# Patient Record
Sex: Male | Born: 1965 | Race: White | Hispanic: No | Marital: Married | Smoking: Current every day smoker
Health system: Southern US, Community
[De-identification: ages and names within clinical notes are randomized; demographics above are authoritative.]

## PROBLEM LIST (undated history)

## (undated) DIAGNOSIS — F419 Anxiety disorder, unspecified: Secondary | ICD-10-CM

## (undated) DIAGNOSIS — M549 Dorsalgia, unspecified: Secondary | ICD-10-CM

## (undated) DIAGNOSIS — E785 Hyperlipidemia, unspecified: Secondary | ICD-10-CM

## (undated) DIAGNOSIS — L0292 Furuncle, unspecified: Secondary | ICD-10-CM

## (undated) DIAGNOSIS — R12 Heartburn: Secondary | ICD-10-CM

## (undated) DIAGNOSIS — T7840XA Allergy, unspecified, initial encounter: Secondary | ICD-10-CM

## (undated) DIAGNOSIS — G8929 Other chronic pain: Secondary | ICD-10-CM

## (undated) DIAGNOSIS — M1990 Unspecified osteoarthritis, unspecified site: Secondary | ICD-10-CM

## (undated) DIAGNOSIS — K219 Gastro-esophageal reflux disease without esophagitis: Secondary | ICD-10-CM

## (undated) DIAGNOSIS — IMO0001 Reserved for inherently not codable concepts without codable children: Secondary | ICD-10-CM

## (undated) HISTORY — DX: Other chronic pain: G89.29

## (undated) HISTORY — PX: COLONOSCOPY: SHX174

## (undated) HISTORY — DX: Anxiety disorder, unspecified: F41.9

## (undated) HISTORY — DX: Hyperlipidemia, unspecified: E78.5

## (undated) HISTORY — DX: Dorsalgia, unspecified: M54.9

## (undated) HISTORY — PX: INGUINAL HERNIA REPAIR: SUR1180

## (undated) HISTORY — DX: Heartburn: R12

## (undated) HISTORY — DX: Unspecified osteoarthritis, unspecified site: M19.90

## (undated) HISTORY — PX: WISDOM TOOTH EXTRACTION: SHX21

## (undated) HISTORY — PX: KNEE SURGERY: SHX244

## (undated) HISTORY — DX: Gastro-esophageal reflux disease without esophagitis: K21.9

## (undated) HISTORY — PX: HAND SURGERY: SHX662

## (undated) HISTORY — PX: POLYPECTOMY: SHX149

## (undated) HISTORY — DX: Allergy, unspecified, initial encounter: T78.40XA

## (undated) HISTORY — PX: BACK SURGERY: SHX140

---

## 2004-07-01 ENCOUNTER — Ambulatory Visit: Payer: Self-pay | Admitting: Internal Medicine

## 2007-11-24 ENCOUNTER — Ambulatory Visit: Payer: Self-pay

## 2007-12-25 ENCOUNTER — Ambulatory Visit: Payer: Self-pay

## 2008-08-26 ENCOUNTER — Ambulatory Visit: Payer: Self-pay | Admitting: Internal Medicine

## 2010-11-10 ENCOUNTER — Ambulatory Visit: Payer: Self-pay | Admitting: Medical

## 2011-01-21 ENCOUNTER — Other Ambulatory Visit: Payer: Self-pay | Admitting: Internal Medicine

## 2011-01-21 DIAGNOSIS — M545 Low back pain, unspecified: Secondary | ICD-10-CM

## 2011-01-22 ENCOUNTER — Ambulatory Visit: Payer: Self-pay | Admitting: Urology

## 2011-01-24 ENCOUNTER — Ambulatory Visit
Admission: RE | Admit: 2011-01-24 | Discharge: 2011-01-24 | Disposition: A | Discharge status: Home / Self Care | Payer: BC Managed Care – PPO | Source: Ambulatory Visit | Attending: Internal Medicine | Admitting: Internal Medicine

## 2011-01-24 DIAGNOSIS — M545 Low back pain, unspecified: Secondary | ICD-10-CM

## 2011-11-24 ENCOUNTER — Encounter: Payer: Self-pay | Admitting: Family Medicine

## 2011-11-24 ENCOUNTER — Ambulatory Visit (INDEPENDENT_AMBULATORY_CARE_PROVIDER_SITE_OTHER): Payer: BC Managed Care – PPO | Admitting: Family Medicine

## 2011-11-24 VITALS — BP 120/92 | HR 76 | Temp 98.7°F | Ht 70.0 in | Wt 176.0 lb

## 2011-11-24 DIAGNOSIS — E785 Hyperlipidemia, unspecified: Secondary | ICD-10-CM

## 2011-11-24 DIAGNOSIS — G8929 Other chronic pain: Secondary | ICD-10-CM

## 2011-11-24 DIAGNOSIS — M549 Dorsalgia, unspecified: Secondary | ICD-10-CM | POA: Insufficient documentation

## 2011-11-24 NOTE — Progress Notes (Signed)
Subjective:    Patient ID: Benjamin Ball, male    DOB: 1965/04/11, 46 y.o.   MRN: [id number]  HPI  Very pleasant 46 yo male with h/o tobacco abuse, HLD, chronic back pain here to establish care.  Brings in labs from his wellness exam at work.  HLD- TG 101, HDL 50, LDL 155. He refuses statin therapy. Family h/o CAD- dad died of MI at 59, not sure how old he was when he had his first MI.  Tobacco abuse- 1 - 2 ppd, states he has tried "everything," including Chantix. Not ready to quit.  Chronic back pain- followed by pain management.  H/o lumbar bulging discs.  On Vicodin, gabapentin.   Patient Active Problem List  Diagnosis  . Hyperlipidemia  . Chronic back pain   Past Medical History  Diagnosis Date  . Hyperlipidemia   . Chronic back pain    Past Surgical History  Procedure Date  . Knee surgery    History  Substance Use Topics  . Smoking status: Current Every Day Smoker  . Smokeless tobacco: Not on file  . Alcohol Use: Not on file   Family History  Problem Relation Age of Onset  . Heart disease Father    No Known Allergies Current Outpatient Prescriptions on File Prior to Visit  Medication Sig Dispense Refill  . gabapentin (NEURONTIN) 300 MG capsule Take 300 mg by mouth 4 (four) times daily.       The PMH, PSH, Social History, Family History, Medications, and allergies have been reviewed in [facility], and have been updated if relevant.   Review of Systems See HPI Patient reports no  vision/ hearing changes,anorexia, weight change, fever ,adenopathy, persistant / recurrent hoarseness, swallowing issues, chest pain, edema,persistant / recurrent cough, hemoptysis, dyspnea(rest, exertional, paroxysmal nocturnal), gastrointestinal  bleeding (melena, rectal bleeding), abdominal pain, excessive heart burn, GU symptoms(dysuria, hematuria, pyuria, voiding/incontinence  Issues) syncope, focal weakness, severe memory loss, concerning skin lesions, depression, anxiety, abnormal  bruising/bleeding, major joint swelling.       Objective:   Physical Exam BP 120/92  Pulse 76  Temp 98.7 F (37.1 C)  Ht 5\' 10"  (1.778 m)  Wt 176 lb (79.833 kg)  BMI 25.25 kg/m2 General:  overweght male in NAD Eyes:  PERRL Ears:  External ear exam shows no significant lesions or deformities.  Otoscopic examination reveals clear canals, tympanic membranes are intact bilaterally without bulging, retraction, inflammation or discharge. Hearing is grossly normal bilaterally. Nose:  External nasal examination shows no deformity or inflammation. Nasal mucosa are pink and moist without lesions or exudates. Mouth:  Oral mucosa and oropharynx without lesions or exudates.  Teeth in good repair. Neck:  no carotid bruit or thyromegaly no cervical or supraclavicular lymphadenopathy  Lungs:  Normal respiratory effort, chest expands symmetrically. Lungs are clear to auscultation, no crackles or wheezes. Heart:  Normal rate and regular rhythm. S1 and S2 normal without gallop, murmur, click, rub or other extra sounds. Abdomen:  Bowel sounds positive,abdomen soft and non-tender without masses, organomegaly or hernias noted. Pulses:  R and L posterior tibial pulses are full and equal bilaterally  Extremities:  no edema         Assessment & Plan:   1. Hyperlipidemia  Declined starting statin despite discussing the importance of lowering his risks for CAD given family history and tobacco use.   He wants to work on diet and lifestyle modification and will follow up in 3-6 months.  2. Chronic back pain  Managed by pain  management.  Stable.

## 2011-11-24 NOTE — Patient Instructions (Addendum)
Nice to meet you. Please come back to see me in 3-6 months to follow up your cholesterol.

## 2012-02-24 ENCOUNTER — Ambulatory Visit: Payer: BC Managed Care – PPO | Admitting: Family Medicine

## 2012-05-29 ENCOUNTER — Telehealth: Payer: Self-pay | Admitting: Family Medicine

## 2012-05-29 NOTE — Telephone Encounter (Signed)
Rx written and in my box. 

## 2012-05-29 NOTE — Telephone Encounter (Signed)
Order mailed to patient's home address.

## 2012-05-29 NOTE — Telephone Encounter (Signed)
Patient has an appointment for a cpx on 07/20/12.  Patient's wife works for [employer] and she asked for an order for lab work be mailed to patient.

## 2012-07-20 ENCOUNTER — Encounter: Payer: Self-pay | Admitting: Family Medicine

## 2012-07-20 ENCOUNTER — Ambulatory Visit (INDEPENDENT_AMBULATORY_CARE_PROVIDER_SITE_OTHER): Payer: BC Managed Care – PPO | Admitting: Family Medicine

## 2012-07-20 VITALS — BP 118/74 | HR 60 | Temp 98.1°F | Ht 70.0 in | Wt 165.0 lb

## 2012-07-20 DIAGNOSIS — M549 Dorsalgia, unspecified: Secondary | ICD-10-CM

## 2012-07-20 DIAGNOSIS — E559 Vitamin D deficiency, unspecified: Secondary | ICD-10-CM | POA: Insufficient documentation

## 2012-07-20 DIAGNOSIS — L301 Dyshidrosis [pompholyx]: Secondary | ICD-10-CM | POA: Insufficient documentation

## 2012-07-20 DIAGNOSIS — Z23 Encounter for immunization: Secondary | ICD-10-CM

## 2012-07-20 DIAGNOSIS — Z0000 Encounter for general adult medical examination without abnormal findings: Secondary | ICD-10-CM | POA: Insufficient documentation

## 2012-07-20 DIAGNOSIS — G8929 Other chronic pain: Secondary | ICD-10-CM

## 2012-07-20 DIAGNOSIS — E785 Hyperlipidemia, unspecified: Secondary | ICD-10-CM

## 2012-07-20 MED ORDER — VITAMIN D (ERGOCALCIFEROL) 1.25 MG (50000 UNIT) PO CAPS: 50000.0000 [IU] | ORAL_CAPSULE | ORAL | Status: DC

## 2012-07-20 NOTE — Patient Instructions (Addendum)
Let's start taking Vit D 50,000 IU weekly for 6 weeks, then restart your regular dose of 2000 IU daily.  Hand Dermatitis Hand dermatitis (dyshidrotic eczema) is a skin condition in which small, itchy, raised dots or fluid-filled blisters form over the palms of the hands. Outbreaks of hand dermatitis can last 3 to 4 weeks. CAUSES  The cause of hand dermatitis is unknown. However, it occurs most often in patients with a history of allergies such as:  Hay fever.  Allergic asthma.  Allergies to latex. Chemical exposure, injuries, and environmental irritants can make hand dermatitis worse. Washing your hands too frequently can remove natural oils, which can dry out the skin and contribute to outbreaks of hand dermatitis. SYMPTOMS  The most common symptom of hand dermatitis is intense itching. Cracks or grooves (fissures) on the fingers can also develop. Affected areas can be painful, especially areas where large blisters have formed. DIAGNOSIS Your caregiver can usually tell what the problem is by doing a physical exam. PREVENTION  Avoid excessive hand washing.  Avoid the use of harsh chemicals.  Wear protective gloves when handling products that can irritate your skin. TREATMENT  Steroid creams and ointments, such as over-the-counter 1% hydrocortisone cream, can reduce inflammation and improve moisture retention. These should be applied at least 2 to 4 times per day. Your caregiver may ask you to use a stronger prescription steroid cream to help speed the healing of blistered and cracked skin. In severe cases, oral steroid medicine may be needed. If you have an infection, antibiotics may be needed. Your caregiver may also prescribe antihistamines. These medicines help reduce itching. HOME CARE INSTRUCTIONS  Only take over-the-counter or prescription medicines as directed by your caregiver.  You may use wet or cold compresses. This can help:  Alleviate itching.  Increase the  effectiveness of topical creams.  Minimize blisters. SEEK MEDICAL CARE IF:  The rash is not better after 1 week of treatment.  Signs of infection develop, such as redness, tenderness, or yellowish-white fluid (pus).  The rash is spreading. Document Released: 12/28/2004 Document Revised: 03/22/2011 Document Reviewed: 05/27/2010 [facility] Patient Information 2014 [place], [place].

## 2012-07-20 NOTE — Progress Notes (Signed)
Subjective:    Patient ID: Benjamin Ball, male    DOB: 12-31-1965, 47 y.o.   MRN: [id number]  HPI  Very pleasant 47 yo male with h/o tobacco abuse, HLD, chronic back pain here for CPX.  Brings in labs from his wellness exam at work.  HLD- TG 78, HDL 50, LDL 104 (much improved).  Family h/o CAD- dad died of MI at 10, not sure how old he was when he had his first MI.  Tobacco abuse- 1 - 2 ppd, states he has tried "everything," including Chantix. Not ready to quit.  Chronic back pain- followed by pain management.  H/o lumbar bulging discs.  On Vicodin, gabapentin.  He feels this is not helping like it should but is refusing injections and surgery.  Vit D deficiency- Vit D 20.  He is supposed to be taking 2000 IU of vit D daily but often forgets.  Has been more tired lately.  PSA, testosterone, Thyroid function all within normal limits.   Patient Active Problem List   Diagnosis Date Noted  . Routine general medical examination at a health care facility 07/20/2012  . Hyperlipidemia   . Chronic back pain    Past Medical History  Diagnosis Date  . Hyperlipidemia   . Chronic back pain    Past Surgical History  Procedure Laterality Date  . Knee surgery     History  Substance Use Topics  . Smoking status: Current Every Day Smoker  . Smokeless tobacco: Not on file  . Alcohol Use: Not on file   Family History  Problem Relation Age of Onset  . Heart disease Father    No Known Allergies Current Outpatient Prescriptions on File Prior to Visit  Medication Sig Dispense Refill  . Cholecalciferol (VITAMIN D) 2000 UNITS CAPS Take by mouth.      . gabapentin (NEURONTIN) 300 MG capsule Take 300 mg by mouth 4 (four) times daily.      Marland Kitchen HYDROcodone-acetaminophen (NORCO/VICODIN) 5-325 MG per tablet Take 1 tablet by mouth every 8 (eight) hours as needed.       No current facility-administered medications on file prior to visit.   The PMH, PSH, Social History, Family History,  Medications, and allergies have been reviewed in [facility], and have been updated if relevant.   Review of Systems See HPI Patient reports no  vision/ hearing changes,anorexia, weight change, fever ,adenopathy, persistant / recurrent hoarseness, swallowing issues, chest pain, edema,persistant / recurrent cough, hemoptysis, dyspnea(rest, exertional, paroxysmal nocturnal), gastrointestinal  bleeding (melena, rectal bleeding), abdominal pain, excessive heart burn, GU symptoms(dysuria, hematuria, pyuria, voiding/incontinence  Issues) syncope, focal weakness, severe memory loss, concerning skin lesions, depression, anxiety, abnormal bruising/bleeding, major joint swelling.       Objective:   Physical Exam BP 118/74  Pulse 60  Temp(Src) 98.1 F (36.7 C)  Ht 5\' 10"  (1.778 m)  Wt 165 lb (74.844 kg)  BMI 23.68 kg/m2 General:  overweght male in NAD Eyes:  PERRL Ears:  External ear exam shows no significant lesions or deformities.  Otoscopic examination reveals clear canals, tympanic membranes are intact bilaterally without bulging, retraction, inflammation or discharge. Hearing is grossly normal bilaterally. Nose:  External nasal examination shows no deformity or inflammation. Nasal mucosa are pink and moist without lesions or exudates. Mouth:  Oral mucosa and oropharynx without lesions or exudates.  Teeth in good repair. Neck:  no carotid bruit or thyromegaly no cervical or supraclavicular lymphadenopathy  Lungs:  Normal respiratory effort, chest expands symmetrically. Lungs are clear  to auscultation, no crackles or wheezes. Heart:  Normal rate and regular rhythm. S1 and S2 normal without gallop, murmur, click, rub or other extra sounds. Abdomen:  Bowel sounds positive,abdomen soft and non-tender without masses, organomegaly or hernias noted. Pulses:  R and L posterior tibial pulses are full and equal bilaterally  Extremities:  no edema  Dry, peeling skin on palms of hands bilaterally    Assessment &  Plan:  1. Routine general medical examination at a health care facility Reviewed preventive care protocols, scheduled due services, and updated immunizations Discussed nutrition, exercise, diet, and healthy lifestyle. Tdap  2. Chronic back pain Followed by pain management.   3. Hyperlipidemia Improved!  4. Eczema, dyshidrotic Discussed course and tx.  See AVS for support care.  5. Unspecified vitamin D deficiency Vit D 50,000 IU weekly x 6 weeks, then resume 2000 IU daily. The patient indicates understanding of these issues and agrees with the plan.

## 2012-07-25 ENCOUNTER — Encounter: Payer: Self-pay | Admitting: Family Medicine

## 2012-09-29 ENCOUNTER — Other Ambulatory Visit: Payer: Self-pay | Admitting: Orthopaedic Surgery

## 2012-09-29 DIAGNOSIS — M545 Low back pain, unspecified: Secondary | ICD-10-CM

## 2012-10-04 ENCOUNTER — Ambulatory Visit
Admission: RE | Admit: 2012-10-04 | Discharge: 2012-10-04 | Disposition: A | Discharge status: Home / Self Care | Payer: BC Managed Care – PPO | Source: Ambulatory Visit | Attending: Orthopaedic Surgery | Admitting: Orthopaedic Surgery

## 2012-10-04 DIAGNOSIS — M545 Low back pain, unspecified: Secondary | ICD-10-CM

## 2013-03-19 ENCOUNTER — Telehealth: Payer: Self-pay

## 2013-03-19 NOTE — Telephone Encounter (Signed)
Spoke to [place] and informed her Rx is at the front desk and available for pickup.

## 2013-03-19 NOTE — Telephone Encounter (Signed)
Mrs Kohen left v/m that pt stopped smoking 11/2012; For pt  to get less expensive rate on insurance pt needs written order to take to [facility] for a nicotine test. Mrs Lanz request cb when ready for pickup.

## 2013-03-19 NOTE — Telephone Encounter (Signed)
rx written and on my desk.

## 2013-04-12 ENCOUNTER — Encounter: Payer: Self-pay | Admitting: Family Medicine

## 2013-04-12 ENCOUNTER — Ambulatory Visit (INDEPENDENT_AMBULATORY_CARE_PROVIDER_SITE_OTHER): Payer: BC Managed Care – PPO | Admitting: Family Medicine

## 2013-04-12 VITALS — BP 102/72 | HR 60 | Temp 98.2°F

## 2013-04-12 DIAGNOSIS — R55 Syncope and collapse: Secondary | ICD-10-CM

## 2013-04-12 LAB — POCT CBG (FASTING - GLUCOSE)-MANUAL ENTRY: Glucose Fasting, POC: 110 mg/dL — AB (ref 70–99)

## 2013-04-12 NOTE — Progress Notes (Signed)
Pre visit review using our clinic review tool, if applicable. No additional management support is needed unless otherwise documented below in the visit note.  No meds, no allergies.  Skipped breakfast and lunch.  Here with wife for her appointment.   Was standing up, looked at me and asked to go to the BR.  Then eyes rolled back in his head, skin became gray and sweaty.  Stopped talking, no apnea.  I hugged him to keep him from falling and told wife to get staff to room.  Staff arrived.  I put patient in chair, he didn't fall.  Shortly regained speech, orientation.  No apnea.  H/o similar prev with prolonged fasting.   Meds, vitals, and allergies reviewed.   ROS: See HPI.  Otherwise, noncontributory.  Initially unresponsive but breathing, grey skin color, sweaty.  Initial BP low as above.  Remained sitting in chair.   ncat  Mmm rrr ctab abd soft CN 2-12 wnl B, S/S/DTR wnl x4 Speech wnl, orientation wnl.  Skin color returned to normal.   Felt better after a snack.  Glucose checked after he was eating snack.  Repeat BP on standing acceptable.  Nor orthostatic on standing.

## 2013-04-12 NOTE — Assessment & Plan Note (Signed)
Likely from hypoglycemia.   Advised to go home and eat, but not to drive today.   All sx resolved by time he left office.  H/o similar prev.   D/w pt.  He agrees.

## 2013-04-16 ENCOUNTER — Telehealth: Payer: Self-pay | Admitting: Family Medicine

## 2013-04-16 NOTE — Telephone Encounter (Signed)
Relevant patient education mailed to patient.  

## 2013-04-19 ENCOUNTER — Encounter: Payer: Self-pay | Admitting: Family Medicine

## 2013-12-18 ENCOUNTER — Encounter: Payer: Self-pay | Admitting: Family Medicine

## 2013-12-18 ENCOUNTER — Ambulatory Visit (INDEPENDENT_AMBULATORY_CARE_PROVIDER_SITE_OTHER): Payer: BC Managed Care – PPO | Admitting: Family Medicine

## 2013-12-18 ENCOUNTER — Ambulatory Visit: Payer: Self-pay | Admitting: Family Medicine

## 2013-12-18 ENCOUNTER — Encounter: Payer: Self-pay | Admitting: Gastroenterology

## 2013-12-18 VITALS — BP 102/70 | HR 75 | Temp 98.5°F | Ht 70.0 in | Wt 174.5 lb

## 2013-12-18 DIAGNOSIS — M549 Dorsalgia, unspecified: Secondary | ICD-10-CM

## 2013-12-18 DIAGNOSIS — N5089 Other specified disorders of the male genital organs: Secondary | ICD-10-CM

## 2013-12-18 DIAGNOSIS — Z720 Tobacco use: Secondary | ICD-10-CM

## 2013-12-18 DIAGNOSIS — G8929 Other chronic pain: Secondary | ICD-10-CM

## 2013-12-18 DIAGNOSIS — N508 Other specified disorders of male genital organs: Secondary | ICD-10-CM

## 2013-12-18 DIAGNOSIS — Z1211 Encounter for screening for malignant neoplasm of colon: Secondary | ICD-10-CM

## 2013-12-18 DIAGNOSIS — E559 Vitamin D deficiency, unspecified: Secondary | ICD-10-CM

## 2013-12-18 DIAGNOSIS — E785 Hyperlipidemia, unspecified: Secondary | ICD-10-CM

## 2013-12-18 DIAGNOSIS — Z0000 Encounter for general adult medical examination without abnormal findings: Secondary | ICD-10-CM

## 2013-12-18 DIAGNOSIS — Z981 Arthrodesis status: Secondary | ICD-10-CM | POA: Insufficient documentation

## 2013-12-18 LAB — CBC WITH DIFFERENTIAL/PLATELET
BASOS PCT: 0.5 % (ref 0.0–3.0)
Basophils Absolute: 0 10*3/uL (ref 0.0–0.1)
Eosinophils Absolute: 0.3 10*3/uL (ref 0.0–0.7)
Eosinophils Relative: 3.9 % (ref 0.0–5.0)
HCT: 44.3 % (ref 39.0–52.0)
Hemoglobin: 14.6 g/dL (ref 13.0–17.0)
Lymphocytes Relative: 23.9 % (ref 12.0–46.0)
Lymphs Abs: 1.8 10*3/uL (ref 0.7–4.0)
MCHC: 33 g/dL (ref 30.0–36.0)
MCV: 95.9 fl (ref 78.0–100.0)
MONO ABS: 0.6 10*3/uL (ref 0.1–1.0)
Monocytes Relative: 8.6 % (ref 3.0–12.0)
NEUTROS PCT: 63.1 % (ref 43.0–77.0)
Neutro Abs: 4.6 10*3/uL (ref 1.4–7.7)
PLATELETS: 215 10*3/uL (ref 150.0–400.0)
RBC: 4.62 Mil/uL (ref 4.22–5.81)
RDW: 13.8 % (ref 11.5–15.5)
WBC: 7.4 10*3/uL (ref 4.0–10.5)

## 2013-12-18 LAB — COMPREHENSIVE METABOLIC PANEL
ALT: 14 U/L (ref 0–53)
AST: 18 U/L (ref 0–37)
Albumin: 4 g/dL (ref 3.5–5.2)
Alkaline Phosphatase: 76 U/L (ref 39–117)
BUN: 9 mg/dL (ref 6–23)
CO2: 29 mEq/L (ref 19–32)
Calcium: 9.2 mg/dL (ref 8.4–10.5)
Chloride: 104 mEq/L (ref 96–112)
Creatinine, Ser: 1 mg/dL (ref 0.4–1.5)
GFR: 87.91 mL/min (ref >60.00)
Glucose, Bld: 89 mg/dL (ref 70–99)
Potassium: 5.2 mEq/L — ABNORMAL HIGH (ref 3.5–5.1)
SODIUM: 139 meq/L (ref 135–145)
Total Bilirubin: 0.3 mg/dL (ref 0.2–1.2)
Total Protein: 6.8 g/dL (ref 6.0–8.3)

## 2013-12-18 LAB — LIPID PANEL
Cholesterol: 207 mg/dL — ABNORMAL HIGH (ref 0–200)
HDL: 46.1 mg/dL (ref >39.00)
LDL CALC: 142 mg/dL — AB (ref 0–99)
NonHDL: 160.9
TRIGLYCERIDES: 97 mg/dL (ref 0.0–149.0)
Total CHOL/HDL Ratio: 4
VLDL: 19.4 mg/dL (ref 0.0–40.0)

## 2013-12-18 LAB — PSA: PSA: 1.24 ng/mL (ref 0.10–4.00)

## 2013-12-18 MED ORDER — GABAPENTIN 300 MG PO CAPS: 300.0000 mg | ORAL_CAPSULE | Freq: Every day | ORAL | Status: DC

## 2013-12-18 MED ORDER — BUPROPION HCL ER (SMOKING DET) 150 MG PO TB12: 150.0000 mg | ORAL_TABLET | Freq: Two times a day (BID) | ORAL | Status: DC

## 2013-12-18 NOTE — Assessment & Plan Note (Signed)
Unfortunately persistent s/p lumbar fusion. He is asking whether he needs to restart narcotics. I suggested PT- order handed to pt and restarting gabapentin since pain is associated with radiculopathy. The patient indicates understanding of these issues and agrees with the plan.

## 2013-12-18 NOTE — Progress Notes (Signed)
Subjective:    Patient ID: Benjamin Ball, male    DOB: 1965/12/08, 48 y.o.   MRN: [id number]  HPI  Very pleasant 48 yo male with h/o tobacco abuse, HLD, chronic back pain here for CPX.  Influenza vaccine 10/25/13 Tdap 07/20/12 Has never had a colonoscopy.  Due for labs. Had back surgery (360 lumbar fusion) in 11/2012- was doing well then went back to work and having the same pain with radiculopathy.  Saw his neurosurgeon last month and was told "xrays look good" and he is just in the 10% that do not respond to the surgery.  He does have a very physical job. Has tried NSAIDs and Tylenol with minimal response.   Considering water aerobics.  Has never had PT.  Cannabis does help to take the edge off. Was previously followed by pain management, no longer taking vicodin, gabapentin or receiving spinal injections.    Family h/o CAD- dad died of MI at 64, not sure how old he was when he had his first MI.  Tobacco abuse- 1 - 2 ppd, states he has tried gums, patches and Chantix without success. Ready to quit now.    ? Scrotal hernia- noticed a right little mass in his scrotum but has grown in size of past couple of years.  Not painful. Did have a hernia repair on that side when he was a small child.  Patient Active Problem List   Diagnosis Date Noted  . Routine general medical examination at a health care facility 07/20/2012  . Eczema, dyshidrotic 07/20/2012  . Unspecified vitamin D deficiency 07/20/2012  . Hyperlipidemia   . Chronic back pain    Past Medical History  Diagnosis Date  . Hyperlipidemia   . Chronic back pain    Past Surgical History  Procedure Laterality Date  . Knee surgery     History  Substance Use Topics  . Smoking status: Current Every Day Smoker  . Smokeless tobacco: Not on file  . Alcohol Use: Not on file   Family History  Problem Relation Age of Onset  . Heart disease Father    No Known Allergies No current outpatient prescriptions on file prior to  visit.   No current facility-administered medications on file prior to visit.   The PMH, PSH, Social History, Family History, Medications, and allergies have been reviewed in [facility], and have been updated if relevant.   Review of Systems See HPI Denies blood in his stool Has had more diarrhea lately No dysuria No increased urinary frequency No penile discharge No CP or SOB No LE edema No HA No cough No fevers No changes in appetite- weight stable Wt Readings from Last 3 Encounters:  12/18/13 174 lb 8 oz (79.153 kg)  07/20/12 165 lb (74.844 kg)  11/24/11 176 lb (79.833 kg)   Denies anxiety or depression-excited about the upcoming birth of his new grandchild  No current outpatient prescriptions on file prior to visit.   No current facility-administered medications on file prior to visit.    No Known Allergies  Past Medical History  Diagnosis Date  . Hyperlipidemia   . Chronic back pain     Past Surgical History  Procedure Laterality Date  . Knee surgery      Family History  Problem Relation Age of Onset  . Heart disease Father     History   Social History  . Marital Status: Married    Spouse Name: N/A    Number of Children: N/A  .  Years of Education: N/A   Occupational History  . Not on file.   Social History Main Topics  . Smoking status: Current Every Day Smoker  . Smokeless tobacco: Not on file  . Alcohol Use: Not on file  . Drug Use: Not on file  . Sexual Activity: Not on file   Ball Topics Concern  . Not on file   Social History Narrative   Married to Benjamin Ball, daughter is Benjamin Ball.      Works for [employer] in [employer].   The PMH, PSH, Social History, Family History, Medications, and allergies have been reviewed in [facility], and have been updated if relevant.      Objective:   Physical Exam BP 102/70 mmHg  Pulse 75  Temp(Src) 98.5 F (36.9 C) (Oral)  Ht 5\' 10"  (1.778 m)  Wt 174 lb 8 oz (79.153 kg)  BMI 25.04  kg/m2  SpO2 98% General:  overweght male in NAD Eyes:  PERRL Ears:  External ear exam shows no significant lesions or deformities.  Otoscopic examination reveals clear canals, tympanic membranes are intact bilaterally without bulging, retraction, inflammation or discharge. Hearing is grossly normal bilaterally. Nose:  External nasal examination shows no deformity or inflammation. Nasal mucosa are pink and moist without lesions or exudates. Mouth:  Oral mucosa and oropharynx without lesions or exudates.  Teeth in good repair. Neck:  no carotid bruit or thyromegaly no cervical or supraclavicular lymphadenopathy  Lungs:  Normal respiratory effort, chest expands symmetrically. Lungs are clear to auscultation, no crackles or wheezes. Heart:  Normal rate and regular rhythm. S1 and S2 normal without gallop, murmur, click, rub or Ball extra sounds. Abdomen:  Bowel sounds positive,abdomen soft and non-tender without masses, organomegaly or hernias noted. GU:  Large right scrotal mass above testicle, nonTTP Pulses:  R and L posterior tibial pulses are full and equal bilaterally  Extremities:  no edema  Dry, peeling skin on palms of hands bilaterally    Assessment & Plan:

## 2013-12-18 NOTE — Assessment & Plan Note (Addendum)
Reviewed preventive care protocols, scheduled due services, and updated immunizations Discussed nutrition, exercise, diet, and healthy lifestyle.  Agrees to colonoscopy- GI referral placed.  Orders Placed This Encounter  Procedures  . US Scrotum  . CBC with Differential  . Comprehensive metabolic panel  . Lipid panel  . PSA  . Ambulatory referral to Gastroenterology

## 2013-12-18 NOTE — Assessment & Plan Note (Signed)
    Smoking cessation instruction/counseling given:  counseled patient on the dangers of tobacco use, advised patient to stop smoking, and reviewed strategies to maximize success  He is wanting to try Zyban after we discussed options- eRx sent.

## 2013-12-18 NOTE — Patient Instructions (Addendum)
Great to see you. Let me know if you can go to physical therapy at [facility].  We will call you with your lab results.  We are restarting gabapentin- start out with 300 mg at bedtime.  Zyban to help quit smoking.- pick a quit date like we discussed.  We will call you with your GI appointment (colonscopy) appointment.  Please stop by to see Rosaria Ferries on your way out or we can also call you with your scrotal ultrasound appointment.

## 2013-12-18 NOTE — Progress Notes (Signed)
Pre visit review using our clinic review tool, if applicable. No additional management support is needed unless otherwise documented below in the visit note. 

## 2013-12-18 NOTE — Assessment & Plan Note (Signed)
New- scrotal ultrasound for further evaluation. Unclear if this is a mass/cyst at this point. The patient indicates understanding of these issues and agrees with the plan.

## 2013-12-18 NOTE — Addendum Note (Signed)
Addended by: Daralene Milch C on: 12/18/2013 10:58 AM   Modules accepted: Orders, SmartSet

## 2013-12-18 NOTE — Assessment & Plan Note (Signed)
Recheck labs today. 

## 2013-12-19 ENCOUNTER — Encounter: Payer: Self-pay | Admitting: *Deleted

## 2014-01-24 ENCOUNTER — Ambulatory Visit (AMBULATORY_SURGERY_CENTER): Payer: Self-pay | Admitting: *Deleted

## 2014-01-24 VITALS — Ht 70.0 in | Wt 175.0 lb

## 2014-01-24 DIAGNOSIS — Z1211 Encounter for screening for malignant neoplasm of colon: Secondary | ICD-10-CM

## 2014-01-24 MED ORDER — MOVIPREP 100 G PO SOLR: 1.0000 | Freq: Once | ORAL | Status: DC

## 2014-01-24 NOTE — Progress Notes (Signed)
No egg or soy allergy. ewm No diet pills, ewm No home 02 use. ewm No issues with past sedation. ewm emmi to pt's e mail. ewm

## 2014-02-05 ENCOUNTER — Telehealth: Payer: Self-pay | Admitting: Gastroenterology

## 2014-02-05 NOTE — Telephone Encounter (Signed)
Informed patient that I can mail him a 10 dollar off main in rebate. Patient states he does have that already and it's still too expensive. Told patient we can switch him to another prep called Miralax split dose prep which he can purchase the prep over the counter. Also he needs new instructions which I can mail to him. Patient states he would need them by tomorrow and he does not think he would get them in time. Told him he probably would not and I can leave them out front for him to pick up. Patient states he will come by today and get instructions from front desk.

## 2014-02-07 ENCOUNTER — Encounter: Payer: Self-pay | Admitting: Gastroenterology

## 2014-02-07 ENCOUNTER — Ambulatory Visit (AMBULATORY_SURGERY_CENTER): Payer: BLUE CROSS/BLUE SHIELD | Admitting: Gastroenterology

## 2014-02-07 VITALS — BP 107/60 | HR 59 | Temp 96.5°F | Resp 11 | Ht 70.0 in | Wt 175.0 lb

## 2014-02-07 DIAGNOSIS — Z1211 Encounter for screening for malignant neoplasm of colon: Secondary | ICD-10-CM

## 2014-02-07 DIAGNOSIS — D125 Benign neoplasm of sigmoid colon: Secondary | ICD-10-CM | POA: Diagnosis not present

## 2014-02-07 DIAGNOSIS — D120 Benign neoplasm of cecum: Secondary | ICD-10-CM

## 2014-02-07 DIAGNOSIS — K635 Polyp of colon: Secondary | ICD-10-CM | POA: Diagnosis not present

## 2014-02-07 DIAGNOSIS — D123 Benign neoplasm of transverse colon: Secondary | ICD-10-CM | POA: Diagnosis not present

## 2014-02-07 MED ORDER — SODIUM CHLORIDE 0.9 % IV SOLN: 500.0000 mL | INTRAVENOUS | Status: DC

## 2014-02-07 NOTE — Patient Instructions (Signed)
YOU HAD AN ENDOSCOPIC PROCEDURE TODAY AT THE [facility]: Refer to the procedure report that was given to you for any specific questions about what was found during the examination.  If the procedure report does not answer your questions, please call your gastroenterologist to clarify.  If you requested that your care partner not be given the details of your procedure findings, then the procedure report has been included in a sealed envelope for you to review at your convenience later.  YOU SHOULD EXPECT: Some feelings of bloating in the abdomen. Passage of more gas than usual.  Walking can help get rid of the air that was put into your GI tract during the procedure and reduce the bloating. If you had a lower endoscopy (such as a colonoscopy or flexible sigmoidoscopy) you may notice spotting of blood in your stool or on the toilet paper. If you underwent a bowel prep for your procedure, then you may not have a normal bowel movement for a few days.  DIET: Your first meal following the procedure should be a light meal and then it is ok to progress to your normal diet.  A half-sandwich or bowl of soup is an example of a good first meal.  Heavy or fried foods are harder to digest and may make you feel nauseous or bloated.  Likewise meals heavy in dairy and vegetables can cause extra gas to form and this can also increase the bloating.  Drink plenty of fluids but you should avoid alcoholic beverages for 24 hours.  ACTIVITY: Your care partner should take you home directly after the procedure.  You should plan to take it easy, moving slowly for the rest of the day.  You can resume normal activity the day after the procedure however you should NOT DRIVE or use heavy machinery for 24 hours (because of the sedation medicines used during the test).    SYMPTOMS TO REPORT IMMEDIATELY: A gastroenterologist can be reached at any hour.  During normal business hours, 8:30 AM to 5:00 PM Monday through Friday,  call [phone].  After hours and on weekends, please call the GI answering service at [phone] who will take a message and have the physician on call contact you.   Following lower endoscopy (colonoscopy or flexible sigmoidoscopy):  Excessive amounts of blood in the stool  Significant tenderness or worsening of abdominal pains  Swelling of the abdomen that is new, acute  Fever of 100F or higher  FOLLOW UP: If any biopsies were taken you will be contacted by phone or by letter within the next 1-3 weeks.  Call your gastroenterologist if you have not heard about the biopsies in 3 weeks.  Our staff will call the home number listed on your records the next business day following your procedure to check on you and address any questions or concerns that you may have at that time regarding the information given to you following your procedure. This is a courtesy call and so if there is no answer at the home number and we have not heard from you through the emergency physician on call, we will assume that you have returned to your regular daily activities without incident.  SIGNATURES/CONFIDENTIALITY: You and/or your care partner have signed paperwork which will be entered into your electronic medical record.  These signatures attest to the fact that that the information above on your After Visit Summary has been reviewed and is understood.  Full responsibility of the confidentiality of this   discharge information lies with you and/or your care-partner.  Read all of the handouts given to you by your recovery room nurse.  TRY TO STOP SMOKING.

## 2014-02-07 NOTE — Progress Notes (Signed)
Pt awake and alert, VSS pleased with MAC, report to RN

## 2014-02-07 NOTE — Progress Notes (Signed)
Called to room to assist during endoscopic procedure.  Patient ID and intended procedure confirmed with present staff. Received instructions for my participation in the procedure from the performing physician.  

## 2014-02-07 NOTE — Op Note (Signed)
[place]  [address] [place], [id number]   COLONOSCOPY PROCEDURE REPORT  PATIENT: Benjamin Ball, Benjamin Ball  MR#: [id number] BIRTHDATE: 09/25/1965 , 48  yrs. old GENDER: male ENDOSCOPIST: Ladene Artist, MD, [facility] REFERRED PO:IPPGF Aron, M.D. PROCEDURE DATE:  02/07/2014 PROCEDURE:   Colonoscopy with biopsy and Colonoscopy with snare polypectomy First Screening Colonoscopy - Avg.  risk and is 50 yrs.  old or older Yes.  Prior Negative Screening - Now for repeat screening. N/A  History of Adenoma - Now for follow-up colonoscopy & has been > or = to 3 yrs.  N/A  Polyps Removed Today? Yes. ASA CLASS:   Class II INDICATIONS:average risk for colorectal cancer. MEDICATIONS: Monitored anesthesia care and Propofol 250 mg IV DESCRIPTION OF PROCEDURE:   After the risks benefits and alternatives of the procedure were thoroughly explained, informed consent was obtained.  The digital rectal exam revealed no abnormalities of the rectum.   The LB [id number]  endoscope was introduced through the anus and advanced to the cecum, which was identified by both the appendix and ileocecal valve. No adverse events experienced.   The quality of the prep was good, using MoviPrep  The instrument was then slowly withdrawn as the colon was fully examined.  COLON FINDINGS: A sessile polyp measuring 3 mm in size was found at the cecum.  A polypectomy was performed with cold forceps.  The resection was complete, the polyp tissue was completely retrieved and sent to histology.   Two sessile polyps measuring 6 mm in size were found in the transverse colon.  Polypectomies were performed with a cold snare.  The resection was complete, the polyp tissue was completely retrieved and sent to histology.   Three sessile polyps measuring 7 mm in size were found in the sigmoid colon. Polypectomies were performed with a cold snare.  The resection was complete, the polyp tissue was completely retrieved  and sent to histology.   The examination was otherwise normal.  Retroflexed views revealed no abnormalities. The time to cecum=2 minutes 45 seconds.  Withdrawal time=13 minutes 57 seconds.  The scope was withdrawn and the procedure completed. COMPLICATIONS: There were no immediate complications.  ENDOSCOPIC IMPRESSION: 1.   Sessile polyp at the cecum; polypectomy performed with cold forceps 2.   Two sessile polyps in the transverse colon; polypectomies performed with a cold snare 3.   Three sessile polyps in the sigmoid colon; polypectomies performed with a cold snare 4.   The examination was otherwise normal  RECOMMENDATIONS: 1.  Await pathology results 2.  Repeat colonoscopy in 3 year if 3-6 adenomatous; 5 years if 1-2 polyps adenomatous; otherwise 10 years  eSigned:  Ladene Artist, MD, [facility] 02/07/2014 10:09 AM

## 2014-02-08 ENCOUNTER — Telehealth: Payer: Self-pay

## 2014-02-08 NOTE — Telephone Encounter (Signed)
  Follow up Call-  Call back number 02/07/2014  Post procedure Call Back phone  # [phone]  Permission to leave phone message Yes     Patient questions:  Do you have a fever, pain , or abdominal swelling? No. Pain Score  0 *  Have you tolerated food without any problems? Yes.    Have you been able to return to your normal activities? Yes.    Do you have any questions about your discharge instructions: Diet   No. Medications  No. Follow up visit  No.  Do you have questions or concerns about your Care? No.  Actions: * If pain score is 4 or above: No action needed, pain <4.

## 2014-02-12 ENCOUNTER — Encounter: Payer: Self-pay | Admitting: Gastroenterology

## 2014-05-20 DIAGNOSIS — J4520 Mild intermittent asthma, uncomplicated: Secondary | ICD-10-CM | POA: Insufficient documentation

## 2014-05-20 DIAGNOSIS — F17210 Nicotine dependence, cigarettes, uncomplicated: Secondary | ICD-10-CM | POA: Insufficient documentation

## 2015-02-10 ENCOUNTER — Ambulatory Visit (INDEPENDENT_AMBULATORY_CARE_PROVIDER_SITE_OTHER): Payer: BLUE CROSS/BLUE SHIELD | Admitting: Family Medicine

## 2015-02-10 ENCOUNTER — Encounter: Payer: Self-pay | Admitting: Family Medicine

## 2015-02-10 VITALS — BP 130/72 | HR 78 | Temp 98.5°F | Wt 176.2 lb

## 2015-02-10 DIAGNOSIS — N5089 Other specified disorders of the male genital organs: Secondary | ICD-10-CM

## 2015-02-10 DIAGNOSIS — M549 Dorsalgia, unspecified: Secondary | ICD-10-CM | POA: Diagnosis not present

## 2015-02-10 DIAGNOSIS — N509 Disorder of male genital organs, unspecified: Secondary | ICD-10-CM

## 2015-02-10 DIAGNOSIS — G8929 Other chronic pain: Secondary | ICD-10-CM

## 2015-02-10 NOTE — Patient Instructions (Signed)
Great to see you. Please stop by to see Marion on your way out.   

## 2015-02-10 NOTE — Progress Notes (Signed)
Subjective:   Patient ID: Benjamin Ball, male    DOB: 08-04-1965, 50 y.o.   MRN: [id number]  Benjamin Ball is a pleasant 50 y.o. year old male who presents to clinic today with Back Pain and Mass  on 02/10/2015  HPI:  Scrotal mass- saw him for this in 12/2013.   US scrotum done- reviewed in Epic- epididymal cyst which was only slightly larger than it was in 2013.  At that time, he declined urology referral. He feels it is now even larger and starting to bother him.  Back pain- chronic issue.  Remote h/o lumbar surgery in 2014.  Injury at work last week.  Being followed by worker's comp.  On prednisone.   Current Outpatient Prescriptions on File Prior to Visit  Medication Sig Dispense Refill  . Ibuprofen (ADVIL PO) Take 600 mg by mouth as needed.     No current facility-administered medications on file prior to visit.    Allergies  Allergen Reactions  . Norco [Hydrocodone-Acetaminophen] Rash    Peeling of the skin, rash     Past Medical History  Diagnosis Date  . Hyperlipidemia   . Chronic back pain   . GERD (gastroesophageal reflux disease)     occasional    Past Surgical History  Procedure Laterality Date  . Knee surgery    . Back surgery    . Inguinal hernia repair      age 50 or so  . Wisdom tooth extraction      Family History  Problem Relation Age of Onset  . Heart disease Father   . Colon cancer Neg Hx   . Rectal cancer Neg Hx   . Stomach cancer Neg Hx     Social History   Social History  . Marital Status: Married    Spouse Name: N/A  . Number of Children: N/A  . Years of Education: N/A   Occupational History  . Not on file.   Social History Main Topics  . Smoking status: Current Every Day Smoker -- 1.00 packs/day    Types: Cigarettes  . Smokeless tobacco: Never Used  . Alcohol Use: 4.2 oz/week    7 Standard drinks or equivalent per week     Comment: beer, wine, liquor   . Drug Use: No  . Sexual Activity: Not on file   Other  Topics Concern  . Not on file   Social History Narrative   Married to [name], daughter is [name].      Works for [employer] in [employer].   The PMH, PSH, Social History, Family History, Medications, and allergies have been reviewed in [facility] and have been updated if relevant.   Review of Systems  Constitutional: Negative.   HENT: Negative.   Eyes: Negative.   Respiratory: Negative.   Cardiovascular: Negative.   Gastrointestinal: Negative.   Endocrine: Negative.   Genitourinary: Positive for scrotal swelling.  Musculoskeletal: Positive for back pain.  Skin: Negative.   Allergic/Immunologic: Negative.   Neurological: Negative.   Hematological: Negative.   Psychiatric/Behavioral: Negative.   All other systems reviewed and are negative.      Objective:    BP 130/72 mmHg  Pulse 78  Temp(Src) 98.5 F (36.9 C) (Oral)  Wt 176 lb 4 oz (79.946 kg)  SpO2 97%   Physical Exam  Constitutional: He is oriented to person, place, and time. He appears well-developed.  HENT:  Head: Normocephalic.  Cardiovascular: Normal rate.   Pulmonary/Chest: Effort normal.  Genitourinary:  Deferred- agrees to urology referral  Musculoskeletal:  Wearing back brace  Neurological: He is alert and oriented to person, place, and time. No cranial nerve deficit.  Skin: Skin is warm and dry. He is not diaphoretic.  Psychiatric: He has a normal mood and affect. His behavior is normal. Judgment and thought content normal.          Assessment & Plan:   Chronic back pain  Scrotal mass No Follow-up on file.

## 2015-02-10 NOTE — Progress Notes (Signed)
Pre visit review using our clinic review tool, if applicable. No additional management support is needed unless otherwise documented below in the visit note. 

## 2015-02-10 NOTE — Assessment & Plan Note (Signed)
Deteriorated but being followed by employee health. He will keep me updated.

## 2015-02-10 NOTE — Assessment & Plan Note (Signed)
Deteriorated- now symptomatic. Exam deferred as he would prefer to proceed with urology referral. Referral placed

## 2015-02-18 ENCOUNTER — Encounter: Payer: Self-pay | Admitting: Urology

## 2015-02-18 ENCOUNTER — Ambulatory Visit (INDEPENDENT_AMBULATORY_CARE_PROVIDER_SITE_OTHER): Payer: BLUE CROSS/BLUE SHIELD | Admitting: Urology

## 2015-02-18 VITALS — BP 121/74 | HR 71 | Ht 70.5 in | Wt 171.6 lb

## 2015-02-18 DIAGNOSIS — N50819 Testicular pain, unspecified: Secondary | ICD-10-CM | POA: Diagnosis not present

## 2015-02-18 DIAGNOSIS — N503 Cyst of epididymis: Secondary | ICD-10-CM

## 2015-02-18 NOTE — Progress Notes (Signed)
02/18/2015 10:28 AM   Cherie Dark [birth year] [id number]  Referring provider: Lucille Passy, MD [place], [place] [address]  Chief Complaint  Patient presents with  . scrotal mass    referred by Dr. Deborra Medina    HPI: Mr Gersh is a 50yo seen in consultation today for right epididymal cyst. The cyst has been present for several years. He has occasional dull mild nonradiaiting right testicular. He does not take any medication for pain.  He has a hx of vasectomy.  He denies any LUTS   PMH: Past Medical History  Diagnosis Date  . Hyperlipidemia   . Chronic back pain   . GERD (gastroesophageal reflux disease)     occasional  . Heartburn   . Arthritis     Surgical History: Past Surgical History  Procedure Laterality Date  . Knee surgery    . Back surgery    . Inguinal hernia repair      age 19 or so  . Wisdom tooth extraction      Home Medications:    Medication List       This list is accurate as of: 02/18/15 10:28 AM.  Always use your most recent med list.               ADVIL PO  Take 600 mg by mouth as needed.     cyclobenzaprine 10 MG tablet  Commonly known as:  FLEXERIL  Take 10 mg by mouth 3 (three) times daily as needed for muscle spasms. Reported on 02/18/2015     predniSONE 10 MG tablet  Commonly known as:  DELTASONE  Take 10 mg by mouth daily with breakfast. Reported on 02/18/2015     traMADol 50 MG tablet  Commonly known as:  ULTRAM  Take 50 mg by mouth every 6 (six) hours as needed. Reported on 02/18/2015        Allergies:  Allergies  Allergen Reactions  . Norco [Hydrocodone-Acetaminophen] Rash    Peeling of the skin, rash     Family History: Family History  Problem Relation Age of Onset  . Heart disease Father   . Colon cancer Neg Hx   . Rectal cancer Neg Hx   . Stomach cancer Neg Hx     Social History:  reports that he has been smoking Cigarettes.  He has been smoking about 1.00 pack per day. He has never used  smokeless tobacco. He reports that he drinks about 4.2 oz of alcohol per week. He reports that he does not use illicit drugs.  ROS: UROLOGY Frequent Urination?: No Hard to postpone urination?: No Burning/pain with urination?: No Get up at night to urinate?: No Leakage of urine?: No Urine stream starts and stops?: No Trouble starting stream?: No Do you have to strain to urinate?: No Blood in urine?: No Urinary tract infection?: No Sexually transmitted disease?: No Injury to kidneys or bladder?: No Painful intercourse?: No Weak stream?: No Erection problems?: No Penile pain?: No  Gastrointestinal Nausea?: No Vomiting?: No Indigestion/heartburn?: Yes Diarrhea?: Yes Constipation?: No  Constitutional Fever: No Night sweats?: No Weight loss?: Yes Fatigue?: No  Skin Skin rash/lesions?: No Itching?: No  Eyes Blurred vision?: No Double vision?: No  Ears/Nose/Throat Sore throat?: No Sinus problems?: No  Hematologic/Lymphatic Swollen glands?: No Easy bruising?: No  Cardiovascular Leg swelling?: No Chest pain?: No  Respiratory Cough?: No Shortness of breath?: No  Endocrine Excessive thirst?: No  Musculoskeletal Back pain?: Yes Joint pain?: Yes  Neurological Headaches?: Yes Dizziness?: No  Psychologic Depression?: No Anxiety?: No  Physical Exam: BP 121/74 mmHg  Pulse 71  Ht 5' 10.5" (1.791 m)  Wt 77.837 kg (171 lb 9.6 oz)  BMI 24.27 kg/m2  Constitutional:  Alert and oriented, No acute distress. HEENT: [place] AT, moist mucus membranes.  Trachea midline, no masses. Cardiovascular: No clubbing, cyanosis, or edema. Respiratory: Normal respiratory effort, no increased work of breathing. GI: Abdomen is soft, nontender, nondistended, no abdominal masses GU: No CVA tenderness.circumcised phallus. No masses/lesions on penis or scrotum.  3cm right epididymal head cyst. No masses on testis. Normal size Skin: No rashes, bruises or suspicious lesions. Lymph:  No cervical or inguinal adenopathy. Neurologic: Grossly intact, no focal deficits, moving all 4 extremities. Psychiatric: Normal mood and affect.  Laboratory Data: Lab Results  Component Value Date   WBC 7.4 12/18/2013   HGB 14.6 12/18/2013   HCT 44.3 12/18/2013   MCV 95.9 12/18/2013   PLT 215.0 12/18/2013    Lab Results  Component Value Date   CREATININE 1.0 12/18/2013    Lab Results  Component Value Date   PSA 1.24 12/18/2013    No results found for: TESTOSTERONE  No results found for: HGBA1C  Urinalysis No results found for: COLORURINE, APPEARANCEUR, LABSPEC, [place], GLUCOSEU, [place], [place], [place], [place], UROBILINOGEN, NITRITE, LEUKOCYTESUR  Pertinent Imaging: Scrotal US in 2015  Assessment & Plan:    1: right epididymal head cyst - pt wants to call with his desire on scheduling right epididymal cyst excision   There are no diagnoses linked to this encounter.  No Follow-up on file.  Cleon Gustin, [facility] [address], [place], [place] [address] [phone]

## 2015-08-19 ENCOUNTER — Encounter: Payer: Self-pay | Admitting: Urology

## 2015-08-19 ENCOUNTER — Ambulatory Visit: Payer: BLUE CROSS/BLUE SHIELD | Admitting: Urology

## 2015-11-05 ENCOUNTER — Ambulatory Visit
Admission: RE | Admit: 2015-11-05 | Discharge: 2015-11-05 | Disposition: A | Discharge status: Home / Self Care | Payer: BLUE CROSS/BLUE SHIELD | Source: Ambulatory Visit | Attending: Medical | Admitting: Medical

## 2015-11-05 ENCOUNTER — Other Ambulatory Visit: Payer: Self-pay | Admitting: Medical

## 2015-11-05 DIAGNOSIS — R52 Pain, unspecified: Secondary | ICD-10-CM

## 2015-11-05 DIAGNOSIS — M25562 Pain in left knee: Secondary | ICD-10-CM | POA: Insufficient documentation

## 2015-11-05 DIAGNOSIS — R609 Edema, unspecified: Secondary | ICD-10-CM

## 2015-11-05 DIAGNOSIS — M7989 Other specified soft tissue disorders: Secondary | ICD-10-CM | POA: Insufficient documentation

## 2016-03-10 ENCOUNTER — Encounter: Payer: Self-pay | Admitting: Urology

## 2016-03-10 ENCOUNTER — Other Ambulatory Visit: Payer: Self-pay | Admitting: Radiology

## 2016-03-10 ENCOUNTER — Ambulatory Visit: Payer: BLUE CROSS/BLUE SHIELD | Admitting: Urology

## 2016-03-10 ENCOUNTER — Telehealth: Payer: Self-pay | Admitting: Radiology

## 2016-03-10 VITALS — BP 119/73 | HR 69 | Ht 70.5 in | Wt 168.7 lb

## 2016-03-10 DIAGNOSIS — N4341 Spermatocele of epididymis, single: Secondary | ICD-10-CM | POA: Insufficient documentation

## 2016-03-10 DIAGNOSIS — N4340 Spermatocele of epididymis, unspecified: Secondary | ICD-10-CM

## 2016-03-10 NOTE — Telephone Encounter (Signed)
Notified pt of surgery scheduled with Dr Erlene Quan on 04/19/16 per pt request, pre-admit testing phone interview on 04/09/16 between 9am-1pm, & to call Friday prior to surgery for arrival time to SDS. Pt voices understanding.

## 2016-03-10 NOTE — Progress Notes (Signed)
03/10/2016 7:40 AM   Benjamin Ball [birth year] [id number]  Referring provider: Lucille Passy, MD [place], [place] [address]  CC: FU Spermatocele, discuss surgeyr  HPI:  1. Right Spermatocele -  Slowly progressive right spermatocele x years, some increasing bother. [place] 2015 confirms non-complex spermatocele. Has h/o vasectomy.  Today "Benjamin Ball" is seen in f/u above and rediscuss possible spermatocelectomy. He continues to have localizing rt scrotal pain to area of spermatocele.    PMH: Past Medical History:  Diagnosis Date  . Arthritis   . Chronic back pain   . GERD (gastroesophageal reflux disease)    occasional  . Heartburn   . Hyperlipidemia     Surgical History: Past Surgical History:  Procedure Laterality Date  . BACK SURGERY    . INGUINAL HERNIA REPAIR     age 51 or so  . KNEE SURGERY    . WISDOM TOOTH EXTRACTION      Home Medications:  Allergies as of 03/10/2016      Reactions   Norco [hydrocodone-acetaminophen] Rash   Peeling of the skin, rash       Medication List       Accurate as of 03/10/16  7:40 AM. Always use your most recent med list.          ADVIL PO Take 600 mg by mouth as needed.   cyclobenzaprine 10 MG tablet Commonly known as:  FLEXERIL Take 10 mg by mouth 3 (three) times daily as needed for muscle spasms. Reported on 02/18/2015   predniSONE 10 MG tablet Commonly known as:  DELTASONE Take 10 mg by mouth daily with breakfast. Reported on 02/18/2015   traMADol 50 MG tablet Commonly known as:  ULTRAM Take 50 mg by mouth every 6 (six) hours as needed. Reported on 02/18/2015       Allergies:  Allergies  Allergen Reactions  . Norco [Hydrocodone-Acetaminophen] Rash    Peeling of the skin, rash     Family History: Family History  Problem Relation Age of Onset  . Heart disease Father   . Colon cancer Neg Hx   . Rectal cancer Neg Hx   . Stomach cancer Neg Hx     Social History:  reports that he has been smoking  Cigarettes.  He has been smoking about 1.00 pack per day. He has never used smokeless tobacco. He reports that he drinks about 4.2 oz of alcohol per week . He reports that he does not use drugs.    Review of Systems  Gastrointestinal (upper)  : Negative for upper GI symptoms  Gastrointestinal (lower) : Negative for lower GI symptoms  Constitutional : Negative for symptoms  Skin: Negative for skin symptoms  Eyes: Negative for eye symptoms  Ear/Nose/Throat : Negative for Ear/Nose/Throat symptoms  Hematologic/Lymphatic: Negative for Hematologic/Lymphatic symptoms  Cardiovascular : Negative for cardiovascular symptoms  Respiratory : Negative for respiratory symptoms  Endocrine: Negative for endocrine symptoms  Musculoskeletal: Negative for musculoskeletal symptoms  Neurological: Negative for neurological symptoms  Psychologic: Negative for psychiatric symptoms   Physical Exam: There were no vitals taken for this visit.  Constitutional:  Alert and oriented, No acute distress. HEENT: [place] AT, moist mucus membranes.  Trachea midline, no masses. Cardiovascular: No clubbing, cyanosis, or edema. Respiratory: Normal respiratory effort, no increased work of breathing. GI: Abdomen is soft, nontender, nondistended, no abdominal masses GU: No CVA tenderness. Phallus straight. No CVAT. Soft Rt spermatocele that is non-fixed.  Skin: No rashes, bruises or suspicious lesions.  Lymph: No cervical or inguinal adenopathy. Neurologic: Grossly intact, no focal deficits, moving all 4 extremities. Psychiatric: Normal mood and affect.  Laboratory Data: Lab Results  Component Value Date   WBC 7.4 12/18/2013   HGB 14.6 12/18/2013   HCT 44.3 12/18/2013   MCV 95.9 12/18/2013   PLT 215.0 12/18/2013    Lab Results  Component Value Date   CREATININE 1.0 12/18/2013    Lab Results  Component Value Date   PSA 1.24 12/18/2013    No results found for: TESTOSTERONE  No results  found for: HGBA1C  Urinalysis No results found for: COLORURINE, APPEARANCEUR, LABSPEC, [place], GLUCOSEU, HGBUR, BILIRUBINUR, KETONESUR, PROTEINUR, UROBILINOGEN, NITRITE, LEUKOCYTESUR  Pertinent Imaging: As per HPI, Scrotal US indepentantly reiewed  Assessment & Plan:    1. Right Spermatocele - rediscussed optios of observation (not dangerous), aspiration / sclerotherapy (painful, high chance of recurrence), and surgical exicsion (most definitive). He opts for excision and I agree. Risks, benefits, expected peri-op course, need for possible temporary scrotal drain discussed.    Alexis Frock, [facility] [address] [place], [place] [address] [phone]

## 2016-04-09 ENCOUNTER — Encounter
Admission: RE | Admit: 2016-04-09 | Discharge: 2016-04-09 | Disposition: A | Discharge status: Home / Self Care | Payer: BLUE CROSS/BLUE SHIELD | Source: Ambulatory Visit | Attending: Urology | Admitting: Urology

## 2016-04-09 DIAGNOSIS — L0292 Furuncle, unspecified: Secondary | ICD-10-CM

## 2016-04-09 HISTORY — DX: Furuncle, unspecified: L02.92

## 2016-04-09 HISTORY — DX: Reserved for inherently not codable concepts without codable children: IMO0001

## 2016-04-09 NOTE — Patient Instructions (Signed)
  Your procedure is scheduled on: 04-19-16  Report to Same Day Surgery 2nd floor medical mall [facility] Entrance-take elevator on left to 2nd floor.  Check in with surgery information desk.) To find out your arrival time please call [phone] between 1PM - 3PM on 04-16-16  Remember: Instructions that are not followed completely may result in serious medical risk, up to and including death, or upon the discretion of your surgeon and anesthesiologist your surgery may need to be rescheduled.    _x___ 1. Do not eat food or drink liquids after midnight. No gum chewing or hard candies.     __x__ 2. No Alcohol for 24 hours before or after surgery.   __x__3. No Smoking for 24 prior to surgery.   ____  4. Bring all medications with you on the day of surgery if instructed.    __x__ 5. Notify your doctor if there is any change in your medical condition     (cold, fever, infections).     Do not wear jewelry, make-up, hairpins, clips or nail polish.  Do not wear lotions, powders, or perfumes. You may wear deodorant.  Do not shave 48 hours prior to surgery. Men may shave face and neck.  Do not bring valuables to the hospital.    [facility] is not responsible for any belongings or valuables.               Contacts, dentures or bridgework may not be worn into surgery.  Leave your suitcase in the car. After surgery it may be brought to your room.  For patients admitted to the hospital, discharge time is determined by your treatment team.   Patients discharged the day of surgery will not be allowed to drive home.  You will need someone to drive you home and stay with you the night of your procedure.    Please read over the following fact sheets that you were given:   [facility] Preparing for Surgery and or MRSA Information   ____ Take anti-hypertensive (unless it includes a diuretic), cardiac, seizure, asthma,     anti-reflux and psychiatric medicines. These include:  1.  NONE  2.  3.  4.  5.  6.  ____Fleets enema or Magnesium Citrate as directed.   ____ Use CHG Soap or sage wipes as directed on instruction sheet   ____ Use inhalers on the day of surgery and bring to hospital day of surgery  ____ Stop Metformin and Janumet 2 days prior to surgery.    ____ Take 1/2 of usual insulin dose the night before surgery and none on the morning     surgery.   ____ Follow recommendations from Cardiologist, Pulmonologist or PCP regarding          stopping Aspirin, Coumadin, Pllavix ,Eliquis, Effient, or Pradaxa, and Pletal.  X____Stop Anti-inflammatories such as Advil, Aleve, Ibuprofen, Motrin, Naproxen, Naprosyn, Goodies powders or aspirin products-STOP 7 DAYS PRIOR TO SURGERY-OK to take Tylenol    ____ Stop supplements until after surgery.     ____ Bring C-Pap to the hospital.

## 2016-04-18 MED ORDER — CLINDAMYCIN PHOSPHATE 900 MG/50ML IV SOLN
900.0000 mg | INTRAVENOUS | Status: AC
  Administered 2016-04-19 (×2): 900 mg via INTRAVENOUS

## 2016-04-19 ENCOUNTER — Ambulatory Visit: Payer: BLUE CROSS/BLUE SHIELD | Admitting: Anesthesiology

## 2016-04-19 ENCOUNTER — Ambulatory Visit
Admission: RE | Admit: 2016-04-19 | Discharge: 2016-04-19 | Disposition: A | Discharge status: Home / Self Care | Payer: BLUE CROSS/BLUE SHIELD | Source: Ambulatory Visit | Attending: Urology | Admitting: Urology

## 2016-04-19 ENCOUNTER — Encounter
Admission: RE | Disposition: A | Discharge status: Home / Self Care | Payer: Self-pay | Source: Ambulatory Visit | Attending: Urology

## 2016-04-19 DIAGNOSIS — Z7952 Long term (current) use of systemic steroids: Secondary | ICD-10-CM | POA: Diagnosis not present

## 2016-04-19 DIAGNOSIS — Z86718 Personal history of other venous thrombosis and embolism: Secondary | ICD-10-CM | POA: Insufficient documentation

## 2016-04-19 DIAGNOSIS — F17210 Nicotine dependence, cigarettes, uncomplicated: Secondary | ICD-10-CM | POA: Diagnosis not present

## 2016-04-19 DIAGNOSIS — N4341 Spermatocele of epididymis, single: Secondary | ICD-10-CM | POA: Insufficient documentation

## 2016-04-19 DIAGNOSIS — N4340 Spermatocele of epididymis, unspecified: Secondary | ICD-10-CM | POA: Diagnosis not present

## 2016-04-19 HISTORY — PX: SPERMATOCELECTOMY: SHX2420

## 2016-04-19 SURGERY — EXCISION, SPERMATOCELE
Anesthesia: General | Laterality: Right | Wound class: Clean

## 2016-04-19 MED ORDER — FENTANYL CITRATE (PF) 100 MCG/2ML IJ SOLN
INTRAMUSCULAR | Status: DC | PRN
  Administered 2016-04-19 (×2): 50 ug via INTRAVENOUS

## 2016-04-19 MED ORDER — DEXAMETHASONE SODIUM PHOSPHATE 10 MG/ML IJ SOLN
INTRAMUSCULAR | Status: DC | PRN
  Administered 2016-04-19: 10 mg via INTRAVENOUS

## 2016-04-19 MED ORDER — FAMOTIDINE 20 MG PO TABS
20.0000 mg | ORAL_TABLET | Freq: Once | ORAL | Status: AC
  Administered 2016-04-19: 20 mg via ORAL

## 2016-04-19 MED ORDER — LACTATED RINGERS IV SOLN
INTRAVENOUS | Status: DC
  Administered 2016-04-19: 07:00:00 via INTRAVENOUS

## 2016-04-19 MED ORDER — GLYCOPYRROLATE 0.2 MG/ML IJ SOLN
INTRAMUSCULAR | Status: AC
  Filled 2016-04-19: qty 1

## 2016-04-19 MED ORDER — PROPOFOL 10 MG/ML IV BOLUS
INTRAVENOUS | Status: DC | PRN
  Administered 2016-04-19: 200 mg via INTRAVENOUS

## 2016-04-19 MED ORDER — ACETAMINOPHEN 10 MG/ML IV SOLN
INTRAVENOUS | Status: DC | PRN
  Administered 2016-04-19: 1000 mg via INTRAVENOUS

## 2016-04-19 MED ORDER — HYDROCODONE-ACETAMINOPHEN 5-325 MG PO TABS: 1.0000 | ORAL_TABLET | Freq: Four times a day (QID) | ORAL | 0 refills | Status: DC | PRN

## 2016-04-19 MED ORDER — FENTANYL CITRATE (PF) 100 MCG/2ML IJ SOLN
INTRAMUSCULAR | Status: AC
  Administered 2016-04-19: 25 ug via INTRAVENOUS
  Filled 2016-04-19: qty 2

## 2016-04-19 MED ORDER — FENTANYL CITRATE (PF) 100 MCG/2ML IJ SOLN
INTRAMUSCULAR | Status: AC
  Filled 2016-04-19: qty 2

## 2016-04-19 MED ORDER — ONDANSETRON HCL 4 MG/2ML IJ SOLN: 4.0000 mg | Freq: Once | INTRAMUSCULAR | Status: DC | PRN

## 2016-04-19 MED ORDER — HYDROCODONE-ACETAMINOPHEN 5-325 MG PO TABS
1.0000 | ORAL_TABLET | Freq: Four times a day (QID) | ORAL | Status: DC | PRN
  Administered 2016-04-19: 2 via ORAL

## 2016-04-19 MED ORDER — DOCUSATE SODIUM 100 MG PO CAPS: 100.0000 mg | ORAL_CAPSULE | Freq: Two times a day (BID) | ORAL | 0 refills | Status: DC

## 2016-04-19 MED ORDER — DEXAMETHASONE SODIUM PHOSPHATE 10 MG/ML IJ SOLN
INTRAMUSCULAR | Status: AC
  Filled 2016-04-19: qty 1

## 2016-04-19 MED ORDER — GLYCOPYRROLATE 0.2 MG/ML IJ SOLN
INTRAMUSCULAR | Status: DC | PRN
  Administered 2016-04-19: 0.2 mg via INTRAVENOUS

## 2016-04-19 MED ORDER — LIDOCAINE HCL (CARDIAC) 20 MG/ML IV SOLN
INTRAVENOUS | Status: DC | PRN
  Administered 2016-04-19: 100 mg via INTRAVENOUS

## 2016-04-19 MED ORDER — LIDOCAINE HCL (PF) 2 % IJ SOLN
INTRAMUSCULAR | Status: AC
  Filled 2016-04-19: qty 2

## 2016-04-19 MED ORDER — FAMOTIDINE 20 MG PO TABS
ORAL_TABLET | ORAL | Status: AC
  Administered 2016-04-19: 20 mg via ORAL
  Filled 2016-04-19: qty 1

## 2016-04-19 MED ORDER — IPRATROPIUM-ALBUTEROL 0.5-2.5 (3) MG/3ML IN SOLN
3.0000 mL | Freq: Once | RESPIRATORY_TRACT | Status: AC
  Administered 2016-04-19: 3 mL via RESPIRATORY_TRACT

## 2016-04-19 MED ORDER — CLINDAMYCIN PHOSPHATE 900 MG/50ML IV SOLN
INTRAVENOUS | Status: AC
  Administered 2016-04-19: 900 mg via INTRAVENOUS
  Filled 2016-04-19: qty 50

## 2016-04-19 MED ORDER — IPRATROPIUM-ALBUTEROL 0.5-2.5 (3) MG/3ML IN SOLN: 3.0000 mL | Freq: Four times a day (QID) | RESPIRATORY_TRACT | Status: DC

## 2016-04-19 MED ORDER — MIDAZOLAM HCL 2 MG/2ML IJ SOLN
INTRAMUSCULAR | Status: DC | PRN
  Administered 2016-04-19: 2 mg via INTRAVENOUS

## 2016-04-19 MED ORDER — MIDAZOLAM HCL 2 MG/2ML IJ SOLN
INTRAMUSCULAR | Status: AC
  Filled 2016-04-19: qty 2

## 2016-04-19 MED ORDER — BUPIVACAINE HCL (PF) 0.5 % IJ SOLN
INTRAMUSCULAR | Status: AC
  Filled 2016-04-19: qty 30

## 2016-04-19 MED ORDER — ACETAMINOPHEN 10 MG/ML IV SOLN
INTRAVENOUS | Status: AC
  Filled 2016-04-19: qty 100

## 2016-04-19 MED ORDER — PHENYLEPHRINE HCL 10 MG/ML IJ SOLN
INTRAMUSCULAR | Status: AC
  Filled 2016-04-19: qty 1

## 2016-04-19 MED ORDER — PROPOFOL 10 MG/ML IV BOLUS
INTRAVENOUS | Status: AC
  Filled 2016-04-19: qty 40

## 2016-04-19 MED ORDER — IPRATROPIUM-ALBUTEROL 0.5-2.5 (3) MG/3ML IN SOLN
RESPIRATORY_TRACT | Status: AC
  Filled 2016-04-19: qty 3

## 2016-04-19 MED ORDER — BUPIVACAINE HCL 0.5 % IJ SOLN
INTRAMUSCULAR | Status: DC | PRN
  Administered 2016-04-19: 10 mL

## 2016-04-19 MED ORDER — ONDANSETRON HCL 4 MG/2ML IJ SOLN
INTRAMUSCULAR | Status: AC
  Filled 2016-04-19: qty 2

## 2016-04-19 MED ORDER — HYDROCODONE-ACETAMINOPHEN 5-325 MG PO TABS
ORAL_TABLET | ORAL | Status: DC
Start: 2016-04-19 — End: 2016-04-19
  Filled 2016-04-19: qty 2

## 2016-04-19 MED ORDER — FENTANYL CITRATE (PF) 100 MCG/2ML IJ SOLN
25.0000 ug | INTRAMUSCULAR | Status: DC | PRN
  Administered 2016-04-19 (×4): 25 ug via INTRAVENOUS

## 2016-04-19 MED ORDER — ONDANSETRON HCL 4 MG/2ML IJ SOLN
INTRAMUSCULAR | Status: DC | PRN
  Administered 2016-04-19: 4 mg via INTRAVENOUS

## 2016-04-19 MED ORDER — EPHEDRINE SULFATE 50 MG/ML IJ SOLN
INTRAMUSCULAR | Status: AC
  Filled 2016-04-19: qty 1

## 2016-04-19 MED ORDER — SODIUM CHLORIDE 0.9 % IJ SOLN
INTRAMUSCULAR | Status: AC
Start: 2016-04-19 — End: 2016-04-19
  Filled 2016-04-19: qty 10

## 2016-04-19 SURGICAL SUPPLY — 34 items
ADH SKN CLS APL DERMABOND .7 (GAUZE/BANDAGES/DRESSINGS) ×1
BLADE SURG 15 STRL LF DISP TIS (BLADE) ×1 IMPLANT
BLADE SURG 15 STRL SS (BLADE) ×2
CANISTER SUCT 1200ML W/VALVE (MISCELLANEOUS) ×2 IMPLANT
DERMABOND ADVANCED (GAUZE/BANDAGES/DRESSINGS) ×1
DERMABOND ADVANCED .7 DNX12 (GAUZE/BANDAGES/DRESSINGS) ×1 IMPLANT
DRAIN PENROSE 5/8X18 LTX STRL (WOUND CARE) ×2 IMPLANT
DRAPE LAPAROTOMY 77X122 PED (DRAPES) ×2 IMPLANT
ELECT CAUTERY NEEDLE TIP 1.0 (MISCELLANEOUS) ×2
ELECT REM PT RETURN 9FT ADLT (ELECTROSURGICAL) ×2
ELECTRODE CAUTERY NEDL TIP 1.0 (MISCELLANEOUS) ×1 IMPLANT
ELECTRODE REM PT RTRN 9FT ADLT (ELECTROSURGICAL) ×1 IMPLANT
GAUZE SPONGE 4X4 12PLY STRL (GAUZE/BANDAGES/DRESSINGS) ×1 IMPLANT
GLOVE BIO SURGEON STRL SZ 6.5 (GLOVE) ×3 IMPLANT
GLOVE BIO SURGEON STRL SZ7 (GLOVE) ×3 IMPLANT
GOWN STRL REUS W/ TWL LRG LVL3 (GOWN DISPOSABLE) ×2 IMPLANT
GOWN STRL REUS W/TWL LRG LVL3 (GOWN DISPOSABLE) ×4
KIT RM TURNOVER STRD PROC AR (KITS) ×2 IMPLANT
LABEL OR SOLS (LABEL) ×2 IMPLANT
NDL HYPO 25X1 1.5 SAFETY (NEEDLE) ×1 IMPLANT
NEEDLE HYPO 25X1 1.5 SAFETY (NEEDLE) ×2 IMPLANT
NS IRRIG 1000ML POUR BTL (IV SOLUTION) ×2 IMPLANT
PACK BASIN MINOR ARMC (MISCELLANEOUS) ×2 IMPLANT
SOL PREP PVP 2OZ (MISCELLANEOUS) ×2
SOLUTION PREP PVP 2OZ (MISCELLANEOUS) ×1 IMPLANT
STRIP CLOSURE SKIN 1/2X4 (GAUZE/BANDAGES/DRESSINGS) ×1 IMPLANT
SUCTION FRAZIER HANDLE 10FR (MISCELLANEOUS) ×1
SUCTION TUBE FRAZIER 10FR DISP (MISCELLANEOUS) ×1 IMPLANT
SUPPORETR ATHLETIC LG (MISCELLANEOUS) ×1 IMPLANT
SUPPORTER ATHLETIC LG (MISCELLANEOUS) ×2
SUT CHROMIC 3 0 SH 27 (SUTURE) ×2 IMPLANT
SUT VIC AB 3-0 SH 27 (SUTURE) ×2
SUT VIC AB 3-0 SH 27X BRD (SUTURE) ×1 IMPLANT
SYRINGE 10CC LL (SYRINGE) ×2 IMPLANT

## 2016-04-19 NOTE — Discharge Instructions (Signed)
Hydrocelectomy/ Spermatocelectomy, Adult, Care After This sheet gives you information about how to care for yourself after your procedure. Your health care provider may also give you more specific instructions. If you have problems or questions, contact your health care provider. What can I expect after the procedure? After your procedure, it is common to have mild discomfort, swelling, and bruising in the pouch that holds your testicles (scrotum). Follow these instructions at home: Bathing   Ask your health care provider when you can shower, take baths, or go swimming.  If you were told to wear an athletic support strap, take it off when you shower or take a bath. Incision care   Follow instructions from your health care provider about how to take care of your incision. Make sure you:  Wash your hands with soap and water before you change your bandage (dressing). If soap and water are not available, use hand sanitizer.  Change your dressing as told by your health care provider.  Leave stitches (sutures) in place.  Check your incision and scrotum every day for signs of infection. Check for:  More redness, swelling, or pain.  Blood or fluid.  Warmth.  Pus or a bad smell. Managing pain, stiffness, and swelling   If directed, apply ice to the injured area:  Put ice in a plastic bag.  Place a towel between your skin and the bag.  Leave the ice on for 20 minutes, 2-3 times per day. Driving   Do not drive for 24 hours if you were given a sedative.  Do not drive or use heavy machinery while taking prescription pain medicine.  Ask your health care provider when it is safe to drive. Activity   Do not do any activities that require great strength and energy (are vigorous) for as long as told by your health care provider.  Return to your normal activities as told by your health care provider. Ask your health care provider what activities are safe for you.  Do not lift anything  that is heavier than 10 lb (4.5 kg) until your health care provider says that it is safe. General instructions   Take over-the-counter and prescription medicines only as told by your health care provider.  Keep all follow-up visits as told by your health care provider. This is important.  If you were given an athletic support strap, wear it as told by your health care provider.  If you had a drain put in during the procedure, you will need to return for a follow-up visit to have it removed. Contact a health care provider if:  Your pain is not controlled with medicine.  You have more redness or swelling around your scrotum.  You have blood or fluid coming from your scrotum.  Your incision feels warm to the touch.  You have pus or a bad smell coming from your scrotum.  You have a fever. This information is not intended to replace advice given to you by your health care provider. Make sure you discuss any questions you have with your health care provider. Document Released: 09/18/2014 Document Revised: 09/27/2015 Document Reviewed: 09/27/2015 Elsevier Interactive Patient Education  2017 [place] Anesthesia, Adult, Care After These instructions provide you with information about caring for yourself after your procedure. Your health care provider may also give you more specific instructions. Your treatment has been planned according to current medical practices, but problems sometimes occur. Call your health care provider if you have any problems or questions  after your procedure. What can I expect after the procedure? After the procedure, it is common to have:  Vomiting.  A sore throat.  Mental slowness. It is common to feel:  Nauseous.  Cold or shivery.  Sleepy.  Tired.  Sore or achy, even in parts of your body where you did not have surgery. Follow these instructions at home: For at least 24 hours after the procedure:   Do not:  Participate in  activities where you could fall or become injured.  Drive.  Use heavy machinery.  Drink alcohol.  Take sleeping pills or medicines that cause drowsiness.  Make important decisions or sign legal documents.  Take care of children on your own.  Rest. Eating and drinking   If you vomit, drink water, juice, or soup when you can drink without vomiting.  Drink enough fluid to keep your urine clear or pale yellow.  Make sure you have little or no nausea before eating solid foods.  Follow the diet recommended by your health care provider. General instructions   Have a responsible adult stay with you until you are awake and alert.  Return to your normal activities as told by your health care provider. Ask your health care provider what activities are safe for you.  Take over-the-counter and prescription medicines only as told by your health care provider.  If you smoke, do not smoke without supervision.  Keep all follow-up visits as told by your health care provider. This is important. Contact a health care provider if:  You continue to have nausea or vomiting at home, and medicines are not helpful.  You cannot drink fluids or start eating again.  You cannot urinate after 8-12 hours.  You develop a skin rash.  You have fever.  You have increasing redness at the site of your procedure. Get help right away if:  You have difficulty breathing.  You have chest pain.  You have unexpected bleeding.  You feel that you are having a life-threatening or urgent problem. This information is not intended to replace advice given to you by your health care provider. Make sure you discuss any questions you have with your health care provider. Document Released: 04/05/2000 Document Revised: 06/02/2015 Document Reviewed: 12/12/2014 Elsevier Interactive Patient Education  2017 [employer].

## 2016-04-19 NOTE — Anesthesia Procedure Notes (Signed)
Procedure Name: LMA Insertion Date/Time: 04/19/2016 7:43 AM Performed by: Doreen Salvage Pre-anesthesia Checklist: Patient identified, Patient being monitored, Timeout performed, Emergency Drugs available and Suction available Patient Re-evaluated:Patient Re-evaluated prior to inductionOxygen Delivery Method: Circle system utilized Preoxygenation: Pre-oxygenation with 100% oxygen Intubation Type: IV induction Ventilation: Mask ventilation without difficulty LMA: LMA inserted LMA Size: 3.5 Tube type: Oral Number of attempts: 1 Placement Confirmation: positive ETCO2 and breath sounds checked- equal and bilateral Tube secured with: Tape Dental Injury: Teeth and Oropharynx as per pre-operative assessment

## 2016-04-19 NOTE — OR Nursing (Signed)
Patient presents today for surgery and states he has had a "cold" for three weeks. Denies feveror chills, reports coughing up clear mucous.  Dr Ronelle Nigh made aware.

## 2016-04-19 NOTE — H&P (Signed)
03/10/2016  --> patient seen and examined personally today on 04/19/16 without change from H&P below.   RRR CTAB   Benjamin Ball [birth year] [id number]  Referring provider: Lucille Passy, MD Benjamin Ball, Benjamin Ball [id number]  CC: FU Spermatocele, discuss surgeyr  HPI:  1. Right Spermatocele -  Slowly progressive right spermatocele x years, some increasing bother. [place] 2015 confirms non-complex spermatocele. Has h/o vasectomy.  Today "Benjamin Ball" is seen in f/u above and rediscuss possible spermatocelectomy. He continues to have localizing rt scrotal pain to area of spermatocele.    PMH:     Past Medical History:  Diagnosis Date  . Arthritis   . Chronic back pain   . GERD (gastroesophageal reflux disease)    occasional  . Heartburn   . Hyperlipidemia     Surgical History:      Past Surgical History:  Procedure Laterality Date  . BACK SURGERY    . INGUINAL HERNIA REPAIR     age 76 or so  . KNEE SURGERY    . WISDOM TOOTH EXTRACTION      Home Medications:      Allergies as of 03/10/2016      Reactions   Norco [hydrocodone-acetaminophen] Rash   Peeling of the skin, rash                Medication List           Accurate as of 03/10/16  7:40 AM. Always use your most recent med list.           ADVIL PO Take 600 mg by mouth as needed.   cyclobenzaprine 10 MG tablet Commonly known as:  FLEXERIL Take 10 mg by mouth 3 (three) times daily as needed for muscle spasms. Reported on 02/18/2015   predniSONE 10 MG tablet Commonly known as:  DELTASONE Take 10 mg by mouth daily with breakfast. Reported on 02/18/2015   traMADol 50 MG tablet Commonly known as:  ULTRAM Take 50 mg by mouth every 6 (six) hours as needed. Reported on 02/18/2015       Allergies:       Allergies  Allergen Reactions  . Norco [Hydrocodone-Acetaminophen] Rash    Peeling of the skin, rash     Family History:      Family History  Problem  Relation Age of Onset  . Heart disease Father   . Colon cancer Neg Hx   . Rectal cancer Neg Hx   . Stomach cancer Neg Hx     Social History:  reports that he has been smoking Cigarettes.  He has been smoking about 1.00 pack per day. He has never used smokeless tobacco. He reports that he drinks about 4.2 oz of alcohol per week . He reports that he does not use drugs.    Review of Systems  Gastrointestinal (upper)  : Negative for upper GI symptoms  Gastrointestinal (lower) : Negative for lower GI symptoms  Constitutional : Negative for symptoms  Skin: Negative for skin symptoms  Eyes: Negative for eye symptoms  Ear/Nose/Throat : Negative for Ear/Nose/Throat symptoms  Hematologic/Lymphatic: Negative for Hematologic/Lymphatic symptoms  Cardiovascular : Negative for cardiovascular symptoms  Respiratory : Negative for respiratory symptoms  Endocrine: Negative for endocrine symptoms  Musculoskeletal: Negative for musculoskeletal symptoms  Neurological: Negative for neurological symptoms  Psychologic: Negative for psychiatric symptoms   Physical Exam: There were no vitals taken for this visit.  Constitutional:  Alert and oriented, No acute distress. HEENT:  AT,  moist mucus membranes.  Trachea midline, no masses. Cardiovascular: No clubbing, cyanosis, or edema. Respiratory: Normal respiratory effort, no increased work of breathing. GI: Abdomen is soft, nontender, nondistended, no abdominal masses GU: No CVA tenderness. Phallus straight. No CVAT. Soft Rt spermatocele that is non-fixed.  Skin: No rashes, bruises or suspicious lesions. Lymph: No cervical or inguinal adenopathy. Neurologic: Grossly intact, no focal deficits, moving all 4 extremities. Psychiatric: Normal mood and affect.  Laboratory Data: RecentLabs       Lab Results  Component Value Date   WBC 7.4 12/18/2013   HGB 14.6 12/18/2013   HCT 44.3 12/18/2013   MCV  95.9 12/18/2013   PLT 215.0 12/18/2013      RecentLabs       Lab Results  Component Value Date   CREATININE 1.0 12/18/2013      RecentLabs       Lab Results  Component Value Date   PSA 1.24 12/18/2013      RecentLabs  No results found for: TESTOSTERONE    RecentLabs  No results found for: HGBA1C    Urinalysis Labs(Brief)  No results found for: COLORURINE, APPEARANCEUR, LABSPEC, PHURINE, GLUCOSEU, HGBUR, BILIRUBINUR, KETONESUR, PROTEINUR, UROBILINOGEN, NITRITE, LEUKOCYTESUR    Pertinent Imaging: As per HPI, Scrotal US indepentantly reiewed  Assessment & Plan:    1. Right Spermatocele - rediscussed optios of observation (not dangerous), aspiration / sclerotherapy (painful, high chance of recurrence), and surgical exicsion (most definitive). He opts for excision and I agree. Risks, benefits, expected peri-op course, need for possible temporary scrotal drain discussed.    Benjamin Ball, [facility] [address], [place], [place]

## 2016-04-19 NOTE — Anesthesia Postprocedure Evaluation (Signed)
Anesthesia Post Note  Patient: Benjamin Ball  Procedure(s) Performed: Procedure(s) (LRB): SPERMATOCELECTOMY (Right)  Patient location during evaluation: PACU Anesthesia Type: General Level of consciousness: awake and alert Pain management: pain level controlled Vital Signs Assessment: post-procedure vital signs reviewed and stable Respiratory status: spontaneous breathing and respiratory function stable Cardiovascular status: stable Anesthetic complications: no     Last Vitals:  Vitals:   04/19/16 0615 04/19/16 0842  BP: 124/78 102/60  Pulse: 76 72  Resp: 16 12  Temp:  36.6 C    Last Pain:  Vitals:   04/19/16 0842  PainSc: Asleep                 Miko Sirico K

## 2016-04-19 NOTE — Anesthesia Post-op Follow-up Note (Cosign Needed)
Anesthesia QCDR form completed.        

## 2016-04-19 NOTE — Op Note (Signed)
Date of procedure: 04/19/16  Preoperative diagnosis:  1. Right spermatocele   Postoperative diagnosis:  1. Right spermatocele   Procedure: 1. Right spermatocelectomy  Surgeon: Hollice Espy, MD  Anesthesia: General  Complications: None  Intraoperative findings: 5 cm right simple spermatocele  EBL: minimal  Specimens: right spermactocele  Drains: none  Indication: Benjamin Ball is a 51 y.o. patient with an enlarging symptomatic right spermatocele.  After reviewing the management options for treatment, he elected to proceed with the above surgical procedure(s).  Specifically, he understands that by removing the right spermatocele, he may or may not have improvement in his chronic intermittent scrotal pain. We have discussed the potential benefits and risks of the procedure, side effects of the proposed treatment, the likelihood of the patient achieving the goals of the procedure, and any potential problems that might occur during the procedure or recuperation. Informed consent has been obtained.  Description of procedure:  The patient was taken to the operating room and general anesthesia was induced.  The patient was placed in the supine position, prepped and draped in the usual sterile fashion, and preoperative antibiotics were administered. A preoperative time-out was performed.   10 cc of half percent Marcaine was instilled into the midline raphae. At approximately 5 cm long midline scrotal incision was then created using a 15 blade. The incision was carried down through the dartos and the right testicle was delivered into the field. The tunica vaginalis was opened at which time a large partially 5 cm right simple appearing spermatocele was identified adjacent to the right epididymis. The testicle itself was otherwise unremarkable. Extremely careful dissection was then used to dissect out the spermatocele using Bovie electrocautery for hemostasis. The right spermatocele sac was  able to be dissected free from the epididymis without violating the cystic structure. This was passed off the field as specimen. Careful hemostasis was then carefully achieved. The right testicle was then placed back into the tunica vaginalis which was oversewn using 3-0 Vicryl. The rectus was then placed back into its normal anatomic position within the right hemiscrotum and the dartos was closed using a 3-0 running Vicryls well. The skin was then closed using 4-0 chromic suture. Dermabond was applied as well as scrotal fluffs and a scrotal support device. He was then reversed anesthesia, taken to the PACU.  Plan: Patient will follow-up in 4 weeks for wound check.  Hollice Espy, M.D.

## 2016-04-19 NOTE — Transfer of Care (Signed)
Immediate Anesthesia Transfer of Care Note  Patient: Benjamin Ball  Procedure(s) Performed: Procedure(s): SPERMATOCELECTOMY (Right)  Patient Location: PACU  Anesthesia Type:General  Level of Consciousness: sedated  Airway & Oxygen Therapy: Patient Spontanous Breathing and Patient connected to face mask oxygen  Post-op Assessment: Report given to RN and Post -op Vital signs reviewed and stable  Post vital signs: Reviewed and stable  Last Vitals:  Vitals:   04/19/16 0615 04/19/16 0842  BP: 124/78 102/60  Pulse: 76 72  Resp: 16 12  Temp:  86.4 C    Complications: No apparent anesthesia complications

## 2016-04-19 NOTE — Anesthesia Preprocedure Evaluation (Signed)
Anesthesia Evaluation  Patient identified by MRN, date of birth, ID band Patient awake    Reviewed: Allergy & Precautions, NPO status , Patient's Chart, lab work & pertinent test results  History of Anesthesia Complications Negative for: history of anesthetic complications  Airway Mallampati: II       Dental   Pulmonary Recent URI , Resolved, Current Smoker,           Cardiovascular + DVT  negative cardio ROS       Neuro/Psych negative neurological ROS     GI/Hepatic Neg liver ROS, GERD  Controlled,  Endo/Other  negative endocrine ROS  Renal/GU negative Renal ROS     Musculoskeletal   Abdominal   Peds  Hematology negative hematology ROS (+)   Anesthesia Other Findings   Reproductive/Obstetrics                             Anesthesia Physical Anesthesia Plan  ASA: II  Anesthesia Plan: General   Post-op Pain Management:    Induction: Intravenous  Airway Management Planned: LMA  Additional Equipment:   Intra-op Plan:   Post-operative Plan:   Informed Consent: I have reviewed the patients History and Physical, chart, labs and discussed the procedure including the risks, benefits and alternatives for the proposed anesthesia with the patient or authorized representative who has indicated his/her understanding and acceptance.     Plan Discussed with:   Anesthesia Plan Comments:         Anesthesia Quick Evaluation

## 2016-04-20 LAB — SURGICAL PATHOLOGY

## 2016-04-23 ENCOUNTER — Ambulatory Visit: Payer: BLUE CROSS/BLUE SHIELD | Admitting: Urology

## 2016-05-06 DIAGNOSIS — M5431 Sciatica, right side: Secondary | ICD-10-CM | POA: Diagnosis not present

## 2016-05-06 DIAGNOSIS — M50322 Other cervical disc degeneration at C5-C6 level: Secondary | ICD-10-CM | POA: Diagnosis not present

## 2016-05-06 DIAGNOSIS — M5432 Sciatica, left side: Secondary | ICD-10-CM | POA: Diagnosis not present

## 2016-05-07 ENCOUNTER — Other Ambulatory Visit: Payer: Self-pay | Admitting: Orthopaedic Surgery

## 2016-05-07 DIAGNOSIS — M5432 Sciatica, left side: Secondary | ICD-10-CM

## 2016-05-07 DIAGNOSIS — M5431 Sciatica, right side: Secondary | ICD-10-CM

## 2016-05-16 ENCOUNTER — Ambulatory Visit
Admission: RE | Admit: 2016-05-16 | Discharge: 2016-05-16 | Disposition: A | Discharge status: Home / Self Care | Payer: BLUE CROSS/BLUE SHIELD | Source: Ambulatory Visit | Attending: Orthopaedic Surgery | Admitting: Orthopaedic Surgery

## 2016-05-16 DIAGNOSIS — M5126 Other intervertebral disc displacement, lumbar region: Secondary | ICD-10-CM | POA: Diagnosis not present

## 2016-05-16 DIAGNOSIS — M5431 Sciatica, right side: Secondary | ICD-10-CM

## 2016-05-16 DIAGNOSIS — M5432 Sciatica, left side: Secondary | ICD-10-CM

## 2016-05-17 ENCOUNTER — Ambulatory Visit (INDEPENDENT_AMBULATORY_CARE_PROVIDER_SITE_OTHER): Payer: BLUE CROSS/BLUE SHIELD | Admitting: Urology

## 2016-05-17 ENCOUNTER — Encounter: Payer: Self-pay | Admitting: Urology

## 2016-05-17 VITALS — BP 96/61 | HR 68 | Ht 71.0 in | Wt 162.9 lb

## 2016-05-17 DIAGNOSIS — N4340 Spermatocele of epididymis, unspecified: Secondary | ICD-10-CM

## 2016-05-17 NOTE — Progress Notes (Signed)
05/17/2016 10:07 AM   Benjamin Ball [birth year] [id number]  Referring provider: Lucille Passy, MD [place], [place] [address]  Chief Complaint  Patient presents with  . Wound Check    HPI: The patient had a right spermatocele removed 04/19/2016. Voiding well. No pain. No swelling   PMH: Past Medical History:  Diagnosis Date  . Arthritis   . Boil 04/09/2016   LEG-PT ENCOURAGED TO GET PCP TO LOOK AT LEG BEFORE UPCOMING SURGERY  . Chronic back pain    SCIATICA  . Cold   . GERD (gastroesophageal reflux disease)    occasional-TUMS PRN  . Heartburn   . Hyperlipidemia     Surgical History: Past Surgical History:  Procedure Laterality Date  . BACK SURGERY    . HAND SURGERY Right   . INGUINAL HERNIA REPAIR     age 51 or so  . KNEE SURGERY    . SPERMATOCELECTOMY Right 04/19/2016   Procedure: SPERMATOCELECTOMY;  Surgeon: Hollice Espy, MD;  Location: [facility] ORS;  Service: Urology;  Laterality: Right;  . WISDOM TOOTH EXTRACTION      Home Medications:  Allergies as of 05/17/2016      Reactions   Latex    WEARING GLOVES FOR AN EXTENDED PERIOD OF TIME      Medication List       Accurate as of 05/17/16 10:07 AM. Always use your most recent med list.          acetaminophen 500 MG tablet Commonly known as:  TYLENOL Take 1,500-2,000 mg by mouth every 6 (six) hours as needed.   calcium carbonate 500 MG chewable tablet Commonly known as:  TUMS - dosed in mg elemental calcium Chew 1 tablet by mouth as needed for indigestion or heartburn.   DAYTIME/NIGHTTIME PO Take 2 tablets by mouth every 4 (four) hours as needed (cold symptoms).   docusate sodium 100 MG capsule Commonly known as:  COLACE Take 1 capsule (100 mg total) by mouth 2 (two) times daily.   guaiFENesin 600 MG 12 hr tablet Commonly known as:  MUCINEX Take 600 mg by mouth 2 (two) times daily.   HYDROcodone-acetaminophen 5-325 MG tablet Commonly known as:  NORCO/VICODIN Take 1-2 tablets by  mouth every 6 (six) hours as needed for moderate pain.   ibuprofen 200 MG tablet Commonly known as:  ADVIL,MOTRIN Take 600-800 mg by mouth every 6 (six) hours as needed for moderate pain.       Allergies:  Allergies  Allergen Reactions  . Latex     WEARING GLOVES FOR AN EXTENDED PERIOD OF TIME    Family History: Family History  Problem Relation Age of Onset  . Heart disease Father   . Colon cancer Neg Hx   . Rectal cancer Neg Hx   . Stomach cancer Neg Hx     Social History:  reports that he has been smoking Cigarettes.  He has a 36.00 pack-year smoking history. He has never used smokeless tobacco. He reports that he drinks about 4.2 oz of alcohol per week . He reports that he does not use drugs.  ROS: UROLOGY Frequent Urination?: No Hard to postpone urination?: No Burning/pain with urination?: No Get up at night to urinate?: No Leakage of urine?: No Urine stream starts and stops?: No Trouble starting stream?: No Do you have to strain to urinate?: No Blood in urine?: No Urinary tract infection?: No Sexually transmitted disease?: No Injury to kidneys or bladder?: No Painful intercourse?: No  Weak stream?: No Erection problems?: No Penile pain?: No  Gastrointestinal Nausea?: No Vomiting?: No Indigestion/heartburn?: No Diarrhea?: No Constipation?: No  Constitutional Fever: No Night sweats?: No Weight loss?: No Fatigue?: No  Skin Skin rash/lesions?: No Itching?: No  Eyes Blurred vision?: No Double vision?: No  Ears/Nose/Throat Sore throat?: No Sinus problems?: No  Hematologic/Lymphatic Swollen glands?: No Easy bruising?: No  Cardiovascular Leg swelling?: No Chest pain?: No  Respiratory Cough?: No Shortness of breath?: No  Endocrine Excessive thirst?: No  Musculoskeletal Back pain?: No Joint pain?: No  Neurological Headaches?: No Dizziness?: No  Psychologic Depression?: No Anxiety?: No  Physical Exam: BP 96/61 (BP Location:  Left Arm, Patient Position: Sitting, Cuff Size: Normal)   Pulse 68   Ht 5\' 11"  (1.803 m)   Wt 162 lb 14.4 oz (73.9 kg)   BMI 22.72 kg/m   Constitutional:  Alert and oriented, No acute distress.  GU: Scrotal examination within normal limits with no tenderness   Laboratory Data: Lab Results  Component Value Date   WBC 7.4 12/18/2013   HGB 14.6 12/18/2013   HCT 44.3 12/18/2013   MCV 95.9 12/18/2013   PLT 215.0 12/18/2013    Lab Results  Component Value Date   CREATININE 1.0 12/18/2013    Lab Results  Component Value Date   PSA 1.24 12/18/2013    No results found for: TESTOSTERONE  No results found for: HGBA1C  Urinalysis No results found for: COLORURINE, APPEARANCEUR, LABSPEC, PHURINE, GLUCOSEU, HGBUR, BILIRUBINUR, KETONESUR, PROTEINUR, UROBILINOGEN, NITRITE, LEUKOCYTESUR  Pertinent Imaging: none  Assessment & Plan:  The patient Is doing great post surgery. We will see him when necessary   There are no diagnoses linked to this encounter.  No Follow-up on file.  Reece Packer, MD  [facility] [address], [place], [place] [address] [phone]

## 2016-06-10 DIAGNOSIS — S39012D Strain of muscle, fascia and tendon of lower back, subsequent encounter: Secondary | ICD-10-CM | POA: Diagnosis not present

## 2016-06-18 ENCOUNTER — Encounter: Payer: Self-pay | Admitting: Medical

## 2016-06-18 ENCOUNTER — Ambulatory Visit: Payer: Self-pay | Admitting: Registered Nurse

## 2016-06-18 VITALS — BP 122/78 | HR 72 | Temp 98.0°F | Resp 16 | Ht 70.0 in | Wt 158.0 lb

## 2016-06-18 DIAGNOSIS — Q762 Congenital spondylolisthesis: Secondary | ICD-10-CM | POA: Insufficient documentation

## 2016-06-18 DIAGNOSIS — M5417 Radiculopathy, lumbosacral region: Secondary | ICD-10-CM

## 2016-06-18 DIAGNOSIS — R300 Dysuria: Secondary | ICD-10-CM

## 2016-06-18 LAB — POCT URINALYSIS DIPSTICK
LEUKOCYTES UA: NEGATIVE
PH UA: 6 (ref 5.0–8.0)
Spec Grav, UA: 1.025 (ref 1.010–1.025)
Urobilinogen, UA: 0.2 E.U./dL

## 2016-06-18 MED ORDER — LIDOCAINE 5 % EX PTCH: 1.0000 | MEDICATED_PATCH | CUTANEOUS | 1 refills | Status: DC

## 2016-06-18 NOTE — Patient Instructions (Signed)
Flank Pain, Adult Flank pain is pain that is located on the side of the body between the upper abdomen and the back. This area is called the flank. The pain may occur over a short period of time (acute), or it may be long-term or recurring (chronic). It may be mild or severe. Flank pain can be caused by many things, including:  Muscle soreness or injury.  Kidney stones or kidney disease.  Stress.  A disease of the spine (vertebral disk disease).  A lung infection (pneumonia).  Fluid around the lungs (pulmonary edema).  A skin rash caused by the chickenpox virus (shingles).  Tumors that affect the back of the abdomen.  Gallbladder disease.  Follow these instructions at home:  Drink enough fluid to keep your urine clear or pale yellow.  Rest as told by your health care provider.  Take over-the-counter and prescription medicines only as told by your health care provider.  Keep a journal to track what has caused your flank pain and what has made it feel better.  Keep all follow-up visits as told by your health care provider. This is important. Contact a health care provider if:  Your pain is not controlled with medicine.  You have new symptoms.  Your pain gets worse.  You have a fever.  Your symptoms last longer than 2-3 days.  You have trouble urinating or you are urinating very frequently. Get help right away if:  You have trouble breathing or you are short of breath.  Your abdomen hurts or it is swollen or red.  You have nausea or vomiting.  You feel faint or you pass out.  You have blood in your urine. Summary  Flank pain is pain that is located on the side of the body between the upper abdomen and the back.  The pain may occur over a short period of time (acute), or it may be long-term or recurring (chronic). It may be mild or severe.  Flank pain can be caused by many things.  Contact your health care provider if your symptoms get worse or they last  longer than 2-3 days. This information is not intended to replace advice given to you by your health care provider. Make sure you discuss any questions you have with your health care provider. Document Released: 02/18/2005 Document Revised: 03/12/2016 Document Reviewed: 03/12/2016 Elsevier Interactive Patient Education  2018 [employer]. Dysuria Dysuria is pain or discomfort while urinating. The pain or discomfort may be felt in the tube that carries urine out of the bladder (urethra) or in the surrounding tissue of the genitals. The pain may also be felt in the groin area, lower abdomen, and lower back. You may have to urinate frequently or have the sudden feeling that you have to urinate (urgency). Dysuria can affect both men and women, but is more common in women. Dysuria can be caused by many different things, including:  Urinary tract infection in women.  Infection of the kidney or bladder.  Kidney stones or bladder stones.  Certain sexually transmitted infections (STIs), such as chlamydia.  Dehydration.  Inflammation of the vagina.  Use of certain medicines.  Use of certain soaps or scented products that cause irritation.  Follow these instructions at home: Watch your dysuria for any changes. The following actions may help to reduce any discomfort you are feeling:  Drink enough fluid to keep your urine clear or pale yellow.  Empty your bladder often. Avoid holding urine for long periods of time.  After a bowel movement or urination, women should cleanse from front to back, using each tissue only once.  Empty your bladder after sexual intercourse.  Take medicines only as directed by your health care provider.  If you were prescribed an antibiotic medicine, finish it all even if you start to feel better.  Avoid caffeine, tea, and alcohol. They can irritate the bladder and make dysuria worse. In men, alcohol may irritate the prostate.  Keep all follow-up visits as  directed by your health care provider. This is important.  If you had any tests done to find the cause of dysuria, it is your responsibility to obtain your test results. Ask the lab or department performing the test when and how you will get your results. Talk with your health care provider if you have any questions about your results.  Contact a health care provider if:  You develop pain in your back or sides.  You have a fever.  You have nausea or vomiting.  You have blood in your urine.  You are not urinating as often as you usually do. Get help right away if:  You pain is severe and not relieved with medicines.  You are unable to hold down any fluids.  You or someone else notices a change in your mental function.  You have a rapid heartbeat at rest.  You have shaking or chills.  You feel extremely weak. This information is not intended to replace advice given to you by your health care provider. Make sure you discuss any questions you have with your health care provider. Document Released: 09/26/2003 Document Revised: 06/05/2015 Document Reviewed: 08/23/2013 Elsevier Interactive Patient Education  2018 [employer]. Sciatica Rehab Ask your health care provider which exercises are safe for you. Do exercises exactly as told by your health care provider and adjust them as directed. It is normal to feel mild stretching, pulling, tightness, or discomfort as you do these exercises, but you should stop right away if you feel sudden pain or your pain gets worse.Do not begin these exercises until told by your health care provider. Stretching and range of motion exercises These exercises warm up your muscles and joints and improve the movement and flexibility of your hips and your back. These exercises also help to relieve pain, numbness, and tingling. Exercise A: Sciatic nerve glide 1. Sit in a chair with your head facing down toward your chest. Place your hands behind your back. Let  your shoulders slump forward. 2. Slowly straighten one of your knees while you tilt your head back as if you are looking toward the ceiling. Only straighten your leg as far as you can without making your symptoms worse. 3. Hold for __________ seconds. 4. Slowly return to the starting position. 5. Repeat with your other leg. Repeat __________ times. Complete this exercise __________ times a day. Exercise B: Knee to chest with hip adduction and internal rotation  1. Lie on your back on a firm surface with both legs straight. 2. Bend one of your knees and move it up toward your chest until you feel a gentle stretch in your lower back and buttock. Then, move your knee toward the shoulder that is on the opposite side from your leg. ? Hold your leg in this position by holding onto the front of your knee. 3. Hold for __________ seconds. 4. Slowly return to the starting position. 5. Repeat with your other leg. Repeat __________ times. Complete this exercise __________ times a day. Exercise  C: Prone extension on elbows  1. Lie on your abdomen on a firm surface. A bed may be too soft for this exercise. 2. Prop yourself up on your elbows. 3. Use your arms to help lift your chest up until you feel a gentle stretch in your abdomen and your lower back. ? This will place some of your body weight on your elbows. If this is uncomfortable, try stacking pillows under your chest. ? Your hips should stay down, against the surface that you are lying on. Keep your hip and back muscles relaxed. 4. Hold for __________ seconds. 5. Slowly relax your upper body and return to the starting position. Repeat __________ times. Complete this exercise __________ times a day. Strengthening exercises These exercises build strength and endurance in your back. Endurance is the ability to use your muscles for a long time, even after they get tired. Exercise D: Pelvic tilt 1. Lie on your back on a firm surface. Bend your knees  and keep your feet flat. 2. Tense your abdominal muscles. Tip your pelvis up toward the ceiling and flatten your lower back into the floor. ? To help with this exercise, you may place a small towel under your lower back and try to push your back into the towel. 3. Hold for __________ seconds. 4. Let your muscles relax completely before you repeat this exercise. Repeat __________ times. Complete this exercise __________ times a day. Exercise E: Alternating arm and leg raises  1. Get on your hands and knees on a firm surface. If you are on a hard floor, you may want to use padding to cushion your knees, such as an exercise mat. 2. Line up your arms and legs. Your hands should be below your shoulders, and your knees should be below your hips. 3. Lift your left leg behind you. At the same time, raise your right arm and straighten it in front of you. ? Do not lift your leg higher than your hip. ? Do not lift your arm higher than your shoulder. ? Keep your abdominal and back muscles tight. ? Keep your hips facing the ground. ? Do not arch your back. ? Keep your balance carefully, and do not hold your breath. 4. Hold for __________ seconds. 5. Slowly return to the starting position and repeat with your right leg and your left arm. Repeat __________ times. Complete this exercise __________ times a day. Posture and body mechanics  Body mechanics refers to the movements and positions of your body while you do your daily activities. Posture is part of body mechanics. Good posture and healthy body mechanics can help to relieve stress in your body's tissues and joints. Good posture means that your spine is in its natural S-curve position (your spine is neutral), your shoulders are pulled back slightly, and your head is not tipped forward. The following are general guidelines for applying improved posture and body mechanics to your everyday activities. Standing   When standing, keep your spine neutral  and your feet about hip-width apart. Keep a slight bend in your knees. Your ears, shoulders, and hips should line up.  When you do a task in which you stand in one place for a long time, place one foot up on a stable object that is 2-4 inches (5-10 cm) high, such as a footstool. This helps keep your spine neutral. Sitting   When sitting, keep your spine neutral and keep your feet flat on the floor. Use a footrest, if necessary, and  keep your thighs parallel to the floor. Avoid rounding your shoulders, and avoid tilting your head forward.  When working at a desk or a computer, keep your desk at a height where your hands are slightly lower than your elbows. Slide your chair under your desk so you are close enough to maintain good posture.  When working at a computer, place your monitor at a height where you are looking straight ahead and you do not have to tilt your head forward or downward to look at the screen. Resting   When lying down and resting, avoid positions that are most painful for you.  If you have pain with activities such as sitting, bending, stooping, or squatting (flexion-based activities), lie in a position in which your body does not bend very much. For example, avoid curling up on your side with your arms and knees near your chest (fetal position).  If you have pain with activities such as standing for a long time or reaching with your arms (extension-based activities), lie with your spine in a neutral position and bend your knees slightly. Try the following positions: ? Lying on your side with a pillow between your knees. ? Lying on your back with a pillow under your knees. Lifting   When lifting objects, keep your feet at least shoulder-width apart and tighten your abdominal muscles.  Bend your knees and hips and keep your spine neutral. It is important to lift using the strength of your legs, not your back. Do not lock your knees straight out.  Always ask for help to  lift heavy or awkward objects. This information is not intended to replace advice given to you by your health care provider. Make sure you discuss any questions you have with your health care provider. Document Released: 12/28/2004 Document Revised: 09/04/2015 Document Reviewed: 09/13/2014 Elsevier Interactive Patient Education  2018 [employer]. Sciatica Sciatica is pain, numbness, weakness, or tingling along the path of the sciatic nerve. The sciatic nerve starts in the lower back and runs down the back of each leg. The nerve controls the muscles in the lower leg and in the back of the knee. It also provides feeling (sensation) to the back of the thigh, the lower leg, and the sole of the foot. Sciatica is a symptom of another medical condition that pinches or puts pressure on the sciatic nerve. Generally, sciatica only affects one side of the body. Sciatica usually goes away on its own or with treatment. In some cases, sciatica may keep coming back (recur). What are the causes? This condition is caused by pressure on the sciatic nerve, or pinching of the sciatic nerve. This may be the result of:  A disk in between the bones of the spine (vertebrae) bulging out too far (herniated disk).  Age-related changes in the spinal disks (degenerative disk disease).  A pain disorder that affects a muscle in the buttock (piriformis syndrome).  Extra bone growth (bone spur) near the sciatic nerve.  An injury or break (fracture) of the pelvis.  Pregnancy.  Tumor (rare).  What increases the risk? The following factors may make you more likely to develop this condition:  Playing sports that place pressure or stress on the spine, such as football or weight lifting.  Having poor strength and flexibility.  A history of back injury.  A history of back surgery.  Sitting for long periods of time.  Doing activities that involve repetitive bending or lifting.  Obesity.  What are the signs or  symptoms? Symptoms can vary from mild to very severe, and they may include:  Any of these problems in the lower back, leg, hip, or buttock: ? Mild tingling or dull aches. ? Burning sensations. ? Sharp pains.  Numbness in the back of the calf or the sole of the foot.  Leg weakness.  Severe back pain that makes movement difficult.  These symptoms may get worse when you cough, sneeze, or laugh, or when you sit or stand for long periods of time. Being overweight may also make symptoms worse. In some cases, symptoms may recur over time. How is this diagnosed? This condition may be diagnosed based on:  Your symptoms.  A physical exam. Your health care provider may ask you to do certain movements to check whether those movements trigger your symptoms.  You may have tests, including: ? Blood tests. ? X-rays. ? MRI. ? CT scan.  How is this treated? In many cases, this condition improves on its own, without any treatment. However, treatment may include:  Reducing or modifying physical activity during periods of pain.  Exercising and stretching to strengthen your abdomen and improve the flexibility of your spine.  Icing and applying heat to the affected area.  Medicines that help: ? To relieve pain and swelling. ? To relax your muscles.  Injections of medicines that help to relieve pain, irritation, and inflammation around the sciatic nerve (steroids).  Surgery.  Follow these instructions at home: Medicines  Take over-the-counter and prescription medicines only as told by your health care provider.  Do not drive or operate heavy machinery while taking prescription pain medicine. Managing pain  If directed, apply ice to the affected area. ? Put ice in a plastic bag. ? Place a towel between your skin and the bag. ? Leave the ice on for 20 minutes, 2-3 times a day.  After icing, apply heat to the affected area before you exercise or as often as told by your health care  provider. Use the heat source that your health care provider recommends, such as a moist heat pack or a heating pad. ? Place a towel between your skin and the heat source. ? Leave the heat on for 20-30 minutes. ? Remove the heat if your skin turns bright red. This is especially important if you are unable to feel pain, heat, or cold. You may have a greater risk of getting burned. Activity  Return to your normal activities as told by your health care provider. Ask your health care provider what activities are safe for you. ? Avoid activities that make your symptoms worse.  Take brief periods of rest throughout the day. Resting in a lying or standing position is usually better than sitting to rest. ? When you rest for longer periods, mix in some mild activity or stretching between periods of rest. This will help to prevent stiffness and pain. ? Avoid sitting for long periods of time without moving. Get up and move around at least one time each hour.  Exercise and stretch regularly, as told by your health care provider.  Do not lift anything that is heavier than 10 lb (4.5 kg) while you have symptoms of sciatica. When you do not have symptoms, you should still avoid heavy lifting, especially repetitive heavy lifting.  When you lift objects, always use proper lifting technique, which includes: ? Bending your knees. ? Keeping the load close to your body. ? Avoiding twisting. General instructions  Use good posture. ? Avoid leaning forward while  sitting. ? Avoid hunching over while standing.  Maintain a healthy weight. Excess weight puts extra stress on your back and makes it difficult to maintain good posture.  Wear supportive, comfortable shoes. Avoid wearing high heels.  Avoid sleeping on a mattress that is too soft or too hard. A mattress that is firm enough to support your back when you sleep may help to reduce your pain.  Keep all follow-up visits as told by your health care provider.  This is important. Contact a health care provider if:  You have pain that wakes you up when you are sleeping.  You have pain that gets worse when you lie down.  Your pain is worse than you have experienced in the past.  Your pain lasts longer than 4 weeks.  You experience unexplained weight loss. Get help right away if:  You lose control of your bowel or bladder (incontinence).  You have: ? Weakness in your lower back, pelvis, buttocks, or legs that gets worse. ? Redness or swelling of your back. ? A burning sensation when you urinate. This information is not intended to replace advice given to you by your health care provider. Make sure you discuss any questions you have with your health care provider. Document Released: 12/22/2000 Document Revised: 06/03/2015 Document Reviewed: 09/06/2014 Elsevier Interactive Patient Education  2017 [employer].

## 2016-06-18 NOTE — Progress Notes (Signed)
Subjective:    Patient ID: Benjamin Ball, male    DOB: 1965-03-02, 51 y.o.   MRN: [id number]  51y/o caucasian male works in Programmer, applications at [employer] with history of fixation lumbarsacral area plates and screws sees [facility] in [place], [place]  Had MRI May 2018 improvement from previous.  Still occasional radiculopathy typically left sometimes right to thigh through groin.  Having left flank/lumbar pain currently, dribbling with urination (new), daily sharp pain that will stop him in his tracks/legs give out (chronically) with certain movements (unchanged from last ortho visit), intermittent groin paresthesias.  Has tried neurontin, tylenol, advil, flexeril, prednisone, tylenol with codeine in the past.  States flexeril messes him up.  Using alcohol and other substances for pain.  Once he falls asleep sleeps through the night.  Has not tried any topicals.  Tylenol 2-4 extra strength at a time and advil 4 tabs at a time po prn.  Denied trauma, fever, chills, loss of bowel/bladder control.  Paramedic friend told him to come into today as +cva tenderness yesterday and have urine checked for kidney stones and that is why he is here today.  PSHx spermatocelectomy, colonoscopy  PMHx spermatocele, epididymal cyst, DDD lumbral sacral        Review of Systems  Constitutional: Positive for unexpected weight change. Negative for activity change, appetite change, chills, diaphoresis, fatigue and fever.  HENT: Negative for congestion, ear pain and sore throat.   Eyes: Negative for pain and discharge.  Respiratory: Negative for cough and wheezing.   Cardiovascular: Negative for chest pain and leg swelling.  Gastrointestinal: Negative for constipation and vomiting.  Genitourinary: Positive for dysuria and flank pain. Negative for decreased urine volume, difficulty urinating, discharge, enuresis, frequency, genital sores, hematuria, penile pain, penile swelling, scrotal swelling, testicular pain and  urgency.  Musculoskeletal: Positive for back pain, myalgias and neck pain. Negative for arthralgias, gait problem, joint swelling and neck stiffness.  Skin: Negative for rash.  Allergic/Immunologic: Negative for environmental allergies and food allergies.  Neurological: Positive for weakness. Negative for dizziness, tremors, seizures, syncope, facial asymmetry, speech difficulty, light-headedness, numbness and headaches.  Hematological: Negative for adenopathy. Does not bruise/bleed easily.  Psychiatric/Behavioral: Negative for agitation, confusion and sleep disturbance. The patient is not nervous/anxious.        Objective:   Physical Exam  Constitutional: He is oriented to person, place, and time. Vital signs are normal. He appears well-developed and well-nourished. He is cooperative.  Non-toxic appearance. He does not have a sickly appearance. He does not appear ill. No distress.  HENT:  Head: Normocephalic and atraumatic.  Right Ear: Hearing and external ear normal.  Left Ear: Hearing and external ear normal.  Nose: Nose normal.  Mouth/Throat: Uvula is midline, oropharynx is clear and moist and mucous membranes are normal. No oropharyngeal exudate.  Eyes: Conjunctivae, EOM and lids are normal. Pupils are equal, round, and reactive to light. Right eye exhibits no discharge. Left eye exhibits no discharge. No scleral icterus.  Neck: Trachea normal and phonation normal. Neck supple. Muscular tenderness present. No spinous process tenderness present. No neck rigidity. Decreased range of motion present. No edema and no erythema present.    Cardiovascular: Normal rate, regular rhythm, normal heart sounds and intact distal pulses.   Pulmonary/Chest: Effort normal and breath sounds normal. No stridor. He has no wheezes.  Abdominal: Soft. He exhibits no distension. There is no guarding.  Musculoskeletal: He exhibits tenderness. He exhibits no edema or deformity.  Right shoulder: Normal.        Left shoulder: Normal.       Right elbow: Normal.      Left elbow: Normal.       Right hip: Normal.       Left hip: Normal.       Right knee: Normal.       Left knee: Normal.       Right ankle: Normal.       Left ankle: Normal.       Cervical back: He exhibits decreased range of motion, tenderness, pain and spasm. He exhibits no bony tenderness, no swelling, no edema, no deformity, no laceration and normal pulse.       Thoracic back: He exhibits tenderness and pain. He exhibits normal range of motion, no bony tenderness, no swelling, no edema, no deformity, no laceration, no spasm and normal pulse.       Lumbar back: He exhibits decreased range of motion, tenderness and pain. He exhibits no bony tenderness, no swelling, no edema, no deformity, no laceration, no spasm and normal pulse.       Back:       Right hand: Normal.       Left hand: Normal.  T10-L1 Paraspinal bilateral TTP; straight leg raise negative bilaterally; SI joints not TTP; on/off exam table and in/out of chair without difficulty; able to touch superior shins with forward flexion stated pulling in low back; extension worsens pain; left rotation and lateral bending worsens pain  Lymphadenopathy:       Head (right side): No submental, no submandibular, no tonsillar, no preauricular, no posterior auricular and no occipital adenopathy present.       Head (left side): No submental, no submandibular, no tonsillar, no preauricular, no posterior auricular and no occipital adenopathy present.    He has no cervical adenopathy.       Right cervical: No superficial cervical, no deep cervical and no posterior cervical adenopathy present.      Left cervical: No superficial cervical, no deep cervical and no posterior cervical adenopathy present.  Neurological: He is alert and oriented to person, place, and time. He has normal strength. He is not disoriented. He displays no atrophy, no tremor and normal reflexes. No cranial nerve deficit or  sensory deficit. He exhibits normal muscle tone. He displays no seizure activity. Coordination and gait normal. GCS eye subscore is 4. GCS verbal subscore is 5. GCS motor subscore is 6.  Reflex Scores:      Patellar reflexes are 2+ on the right side and 2+ on the left side.      Achilles reflexes are 2+ on the right side and 2+ on the left side. Normal heel toe gait  Skin: Skin is warm, dry and intact. No rash noted. He is not diaphoretic. No cyanosis or erythema. No pallor. Nails show no clubbing.  Psychiatric: He has a normal mood and affect. His speech is normal and behavior is normal. Judgment and thought content normal. Cognition and memory are normal.  Nursing note and vitals reviewed.  Discussed with patient in exam room dipstick urine normal except concentrated.  Will [facility] to [facility] for urinalysis and urine culture.  Typically results will be available in 72 hours.  If worsening urinary symptoms, hematuria, unable to urinate every 8 hours patient to be seen this weekend by [facility] and/or ER for re-evaluation.  Discussed with patient to hydrate to keep urine pale yellow.  Reviewed [place] Controlled Substances Website  and patient did not have any Rx dispensed from Jan 2014 to present.      Assessment & Plan:  A-lumbar radiculopathy and dysuria  P-For acute pain, rest, and intermittent application of heat (do not sleep on heating pad) and/or cryotherapy 15 minutes QID.  May also consider thermacare patches but do not apply over lidocaine patch.  If rash develops do not apply another patch over rash.  May also consider ben gay cream/ointment/biofreeze gel topical affected area QID.  Do not apply heat over creams.  Consider epsom salt baths.  Discussed holding static positions for long periods can worsen pain.  encouraged gentle arom avoid heavy lifting.  Patient refused work restrictions at this time.  I discussed longer-term treatment plan of PRN PO NSAIDS pick one e.g.  Advil/aleve/motrin/ibuprofen/naproxen do not take more than one of these in NSAID family at a time.  Discussed avoid more than 2 alcohol drinks per day as can cause gastritis.  May alternate tylenol 1000mg  po QID with nsaid.  Max dose motrin/advil/ibuprofen 800mg  po TID prn.  Discussed doses higher than 1000mg  acetaminophen could cause liver injury. Rx lidocaine patch 5% apply 1 every 12 hours and remove old patch #14 RF1. and I discussed a home back care exercise program with a strengthening and flexibility exercise.  Patient given [employer] handout on lumbago with radiculopathy and rehab exercises.  Proper avoidance of heavy lifting discussed.  Consider physical therapy or chiropractic care and reimaging if not improving.  Call or return to clinic as needed if these symptoms worsen or fail to improve as anticipated especially RED FLAGS: leg weakness, loss of bowel/bladder control or saddle paresthesias.   Patient verbalized understanding of instructions/information and agreed with plan of care and had no further questions at this time.  P2:  Injury Prevention, fitness  Will call patient with results when available. [phone] work or [phone] cell.  Hydrate, avoid dehydration.  Avoid holding urine void on frequent basis every 4 to 6 hours.  If unable to void every 8 hours follow up for re-evaluation with PCM, urgent care or ER.   Call or return to clinic as needed if these symptoms worsen or fail to improve as anticipated.  [employer] handout on cystitis and kidney stones given to patient Patient verbalized agreement and understanding of treatment plan and had no further questions at this time. P2:  Hydrate and cranberry juice

## 2016-06-20 LAB — URINE CULTURE: Organism ID, Bacteria: NO GROWTH

## 2016-06-21 ENCOUNTER — Ambulatory Visit: Payer: Self-pay | Admitting: Medical

## 2016-06-22 ENCOUNTER — Ambulatory Visit: Payer: Self-pay | Admitting: Medical

## 2016-06-22 ENCOUNTER — Ambulatory Visit
Admission: RE | Admit: 2016-06-22 | Discharge: 2016-06-22 | Disposition: A | Discharge status: Home / Self Care | Payer: BLUE CROSS/BLUE SHIELD | Source: Ambulatory Visit | Attending: Medical | Admitting: Medical

## 2016-06-22 ENCOUNTER — Encounter: Payer: Self-pay | Admitting: Medical

## 2016-06-22 VITALS — BP 100/70 | HR 93 | Temp 97.4°F | Resp 16 | Ht 71.0 in | Wt 160.0 lb

## 2016-06-22 DIAGNOSIS — Z0000 Encounter for general adult medical examination without abnormal findings: Secondary | ICD-10-CM

## 2016-06-22 DIAGNOSIS — R109 Unspecified abdominal pain: Secondary | ICD-10-CM | POA: Diagnosis not present

## 2016-06-22 DIAGNOSIS — I708 Atherosclerosis of other arteries: Secondary | ICD-10-CM | POA: Diagnosis not present

## 2016-06-22 NOTE — Progress Notes (Signed)
Subjective:    Patient ID: Benjamin Ball, male    DOB: [birth year], 51 y.o.   MRN: [id number]  HPI  51 yo male with back pain comes in today for recheck. He states the lidocaine patches did not work and he still is having back pain and left flank pain. He still has dribbling urine from his penis and states he probably has had this since his vasectomy( in the 1990's). He states he just "did not want to admit it and he is hardheaded". He is concerned he may have kidney stones.  Saturday he was trying to catch dog, running and fell 3 times, felt he pulled his groin muscles, with abdominal soreness  presented on Sunday and right wrist pain.Wrist is now better. Today groin pain is gone, but still has soreness in abdomen , bending or standing back up or using his recliner 4/10. Still has back pain on the left side, he rates that as 0/10 but comes on ocassionally. Sometimes comes on constantly, last episode almost all day yesterday and last night. Took 500mg   two tablets yesterday, didn't offer any relief. Over the weekend had pain going down the left leg twice. He sees Dr. Alda Berthold at [facility] who told the patient there is nothing surgically that can be done and the next step would be his recommendation to a pain clinic. Dr. Erlene Quan did his recent spermatocele. He Feels better today and is having no back  pain.   Still continues to have left flank pain.  He also admitted to me that at times  He has had to  use 6 shots of Carma Leaven to relieve his pain for his back or smokes "pot" so he doesn't care about his pain.  .Review of Systems  Constitutional: Positive for chills. Negative for fever.  HENT: Negative.   Eyes: Positive for itching.  Respiratory: Negative for cough.   Cardiovascular: Negative for chest pain.  Gastrointestinal: Positive for abdominal pain.  Genitourinary: Positive for flank pain and frequency. Negative for discharge, dysuria, hematuria, penile pain, penile swelling and  testicular pain.  Musculoskeletal: Positive for back pain.  Allergic/Immunologic: Positive for environmental allergies. Negative for food allergies.  Neurological: Negative for syncope.  Hematological: Negative for adenopathy.  Psychiatric/Behavioral: Negative for confusion and hallucinations.    sometimes feels that after he uses the bathroom a few minutes go by and feels like he has to go again. The urinates only a little bit.   He feels his urine is dark and says he does not drink enough and most likely to his hydration status. Objective:   Physical Exam  Constitutional: He is oriented to person, place, and time. Vital signs are normal. He appears well-developed and well-nourished.  HENT:  Head: Normocephalic and atraumatic.  Eyes: EOM are normal. Pupils are equal, round, and reactive to light.  Neck: Normal range of motion.  Musculoskeletal: Normal range of motion.       Arms: Neurological: He is alert and oriented to person, place, and time. He has normal strength. GCS eye subscore is 4. GCS verbal subscore is 5. GCS motor subscore is 6.  Reflex Scores:      Patellar reflexes are 2+ on the right side and 2+ on the left side. Skin: Skin is warm and dry.  Psychiatric: He has a normal mood and affect. His behavior is normal.   Left lumbar pain, non-tender to palp, no CVA tenderness. 5/5 strength on lower leg extension and flexion , negative SLR bilaterally  UA within normal limits, no hematuria   no CVA tenderness The picture above showes where his flank pain is located and the where his back pain was located. Assessment & Plan:  Reviewed UA and lab urinalysis and culture all negative and no growth with patient.  Chronic Back pain with radiculopathy, to follow up with his [facility] doctor for pain management. Abdominal muscle pain/ soreness, try to avoid movements that cause pain, he declined a work note. Perhaps Depression from chronic pain, recommended to patient [facility] resources for counseling to help him cope with his chronic pain. Left flank pain will do a KUB and call patient with results. Reviewed UA  (within normal limits) with patient in clinic today.   Called patient with results of  KUB only calcifications beyond right iliac artery-atherosclerosis. Moderate amount of stool in colon, recommended OTC Miralax take as directed and increase water intake. Will contact him on Thursday by phone to check on patient. He is to return to the clinic as needed. RED Flags of loss of bowel or bladder spontaneously, weakness in legs, saddle paresthesia all reviewed during his previous visit and this visit. Too seek out Emergency Department for evaluation. He has no questions at this time and verbalizes understanding.

## 2016-06-23 LAB — POCT URINALYSIS DIPSTICK
Bilirubin, UA: NEGATIVE
Blood, UA: NEGATIVE
Glucose, UA: NEGATIVE
KETONES UA: NEGATIVE
Leukocytes, UA: NEGATIVE
NITRITE UA: NEGATIVE
PH UA: 7 (ref 5.0–8.0)
PROTEIN UA: NEGATIVE
Spec Grav, UA: 1.01 (ref 1.010–1.025)
Urobilinogen, UA: 0.2 E.U./dL

## 2016-06-29 ENCOUNTER — Telehealth: Payer: Self-pay | Admitting: Medical

## 2016-06-29 NOTE — Telephone Encounter (Signed)
Called to check and see how patients pain was doing. He currently is on vacation until 07/05/16.

## 2016-07-08 ENCOUNTER — Other Ambulatory Visit: Payer: Self-pay | Admitting: Chiropractor

## 2016-07-08 ENCOUNTER — Other Ambulatory Visit: Payer: Self-pay | Admitting: *Deleted

## 2016-07-08 ENCOUNTER — Ambulatory Visit
Admission: RE | Admit: 2016-07-08 | Discharge: 2016-07-08 | Disposition: A | Discharge status: Home / Self Care | Payer: BLUE CROSS/BLUE SHIELD | Source: Ambulatory Visit | Attending: Chiropractor | Admitting: Chiropractor

## 2016-07-08 DIAGNOSIS — M50322 Other cervical disc degeneration at C5-C6 level: Secondary | ICD-10-CM | POA: Diagnosis not present

## 2016-07-08 DIAGNOSIS — M9901 Segmental and somatic dysfunction of cervical region: Secondary | ICD-10-CM

## 2016-07-08 DIAGNOSIS — M9903 Segmental and somatic dysfunction of lumbar region: Secondary | ICD-10-CM

## 2016-07-08 DIAGNOSIS — M545 Low back pain: Secondary | ICD-10-CM | POA: Diagnosis not present

## 2016-07-08 DIAGNOSIS — M50822 Other cervical disc disorders at C5-C6 level: Secondary | ICD-10-CM | POA: Diagnosis not present

## 2016-09-08 ENCOUNTER — Encounter: Payer: Self-pay | Admitting: Adult Health

## 2016-09-08 ENCOUNTER — Ambulatory Visit: Payer: Self-pay | Admitting: Adult Health

## 2016-09-08 VITALS — BP 148/86 | HR 78 | Temp 98.8°F | Wt 157.4 lb

## 2016-09-08 DIAGNOSIS — J0110 Acute frontal sinusitis, unspecified: Secondary | ICD-10-CM

## 2016-09-08 DIAGNOSIS — J40 Bronchitis, not specified as acute or chronic: Secondary | ICD-10-CM

## 2016-09-08 MED ORDER — AMOXICILLIN-POT CLAVULANATE 875-125 MG PO TABS: 1.0000 | ORAL_TABLET | Freq: Two times a day (BID) | ORAL | 0 refills | Status: AC

## 2016-09-08 MED ORDER — ALBUTEROL SULFATE HFA 108 (90 BASE) MCG/ACT IN AERS: 2.0000 | INHALATION_SPRAY | Freq: Four times a day (QID) | RESPIRATORY_TRACT | 2 refills | Status: DC | PRN

## 2016-09-08 MED ORDER — PREDNISONE 10 MG (21) PO TBPK: ORAL_TABLET | ORAL | 0 refills | Status: DC

## 2016-09-08 NOTE — Patient Instructions (Addendum)
Sinusitis, Adult Sinusitis is soreness and inflammation of your sinuses. Sinuses are hollow spaces in the bones around your face. Your sinuses are located:  Around your eyes.  In the middle of your forehead.  Behind your nose.  In your cheekbones.  Your sinuses and nasal passages are lined with a stringy fluid (mucus). Mucus normally drains out of your sinuses. When your nasal tissues become inflamed or swollen, the mucus can become trapped or blocked so air cannot flow through your sinuses. This allows bacteria, viruses, and funguses to grow, which leads to infection. Sinusitis can develop quickly and last for 7?10 days (acute) or for more than 12 weeks (chronic). Sinusitis often develops after a cold. What are the causes? This condition is caused by anything that creates swelling in the sinuses or stops mucus from draining, including:  Allergies.  Asthma.  Bacterial or viral infection.  Abnormally shaped bones between the nasal passages.  Nasal growths that contain mucus (nasal polyps).  Narrow sinus openings.  Pollutants, such as chemicals or irritants in the air.  A foreign object stuck in the nose.  A fungal infection. This is rare.  What increases the risk? The following factors may make you more likely to develop this condition:  Having allergies or asthma.  Having had a recent cold or respiratory tract infection.  Having structural deformities or blockages in your nose or sinuses.  Having a weak immune system.  Doing a lot of swimming or diving.  Overusing nasal sprays.  Smoking.  What are the signs or symptoms? The main symptoms of this condition are pain and a feeling of pressure around the affected sinuses. Other symptoms include:  Upper toothache.  Earache.  Headache.  Bad breath.  Decreased sense of smell and taste.  A cough that may get worse at night.  Fatigue.  Fever.  Thick drainage from your nose. The drainage is often green and  it may contain pus (purulent).  Stuffy nose or congestion.  Postnasal drip. This is when extra mucus collects in the throat or back of the nose.  Swelling and warmth over the affected sinuses.  Sore throat.  Sensitivity to light.  How is this diagnosed? This condition is diagnosed based on symptoms, a medical history, and a physical exam. To find out if your condition is acute or chronic, your health care provider may:  Look in your nose for signs of nasal polyps.  Tap over the affected sinus to check for signs of infection.  View the inside of your sinuses using an imaging device that has a light attached (endoscope).  If your health care provider suspects that you have chronic sinusitis, you may also:  Be tested for allergies.  Have a sample of mucus taken from your nose (nasal culture) and checked for bacteria.  Have a mucus sample examined to see if your sinusitis is related to an allergy.  If your sinusitis does not respond to treatment and it lasts longer than 8 weeks, you may have an MRI or CT scan to check your sinuses. These scans also help to determine how severe your infection is. In rare cases, a bone biopsy may be done to rule out more serious types of fungal sinus disease. How is this treated? Treatment for sinusitis depends on the cause and whether your condition is chronic or acute. If a virus is causing your sinusitis, your symptoms will go away on their own within 10 days. You may be given medicines to relieve your symptoms,   including:  Topical nasal decongestants. They shrink swollen nasal passages and let mucus drain from your sinuses.  Antihistamines. These drugs block inflammation that is triggered by allergies. This can help to ease swelling in your nose and sinuses.  Topical nasal corticosteroids. These are nasal sprays that ease inflammation and swelling in your nose and sinuses.  Nasal saline washes. These rinses can help to get rid of thick mucus in  your nose.  If your condition is caused by bacteria, you will be given an antibiotic medicine. If your condition is caused by a fungus, you will be given an antifungal medicine. Surgery may be needed to correct underlying conditions, such as narrow nasal passages. Surgery may also be needed to remove polyps. Follow these instructions at home: Medicines  Take, use, or apply over-the-counter and prescription medicines only as told by your health care provider. These may include nasal sprays.  If you were prescribed an antibiotic medicine, take it as told by your health care provider. Do not stop taking the antibiotic even if you start to feel better. Hydrate and Humidify  Drink enough water to keep your urine clear or pale yellow. Staying hydrated will help to thin your mucus.  Use a cool mist humidifier to keep the humidity level in your home above 50%.  Inhale steam for 10-15 minutes, 3-4 times a day or as told by your health care provider. You can do this in the bathroom while a hot shower is running.  Limit your exposure to cool or dry air. Rest  Rest as much as possible.  Sleep with your head raised (elevated).  Make sure to get enough sleep each night. General instructions  Apply a warm, moist washcloth to your face 3-4 times a day or as told by your health care provider. This will help with discomfort.  Wash your hands often with soap and water to reduce your exposure to viruses and other germs. If soap and water are not available, use hand sanitizer.  Do not smoke. Avoid being around people who are smoking (secondhand smoke).  Keep all follow-up visits as told by your health care provider. This is important. Contact a health care provider if:  You have a fever.  Your symptoms get worse.  Your symptoms do not improve within 10 days. Get help right away if:  You have a severe headache.  You have persistent vomiting.  You have pain or swelling around your face or  eyes.  You have vision problems.  You develop confusion.  Your neck is stiff.  You have trouble breathing. This information is not intended to replace advice given to you by your health care provider. Make sure you discuss any questions you have with your health care provider. Document Released: 12/28/2004 Document Revised: 08/24/2015 Document Reviewed: 10/23/2014 Elsevier Interactive Patient Education  2017 [employer] Acute Bronchitis, Adult Acute bronchitis is when air tubes (bronchi) in the lungs suddenly get swollen. The condition can make it hard to breathe. It can also cause these symptoms:  A cough.  Coughing up clear, yellow, or green mucus.  Wheezing.  Chest congestion.  Shortness of breath.  A fever.  Body aches.  Chills.  A sore throat.  Follow these instructions at home: Medicines  Take over-the-counter and prescription medicines only as told by your doctor.  If you were prescribed an antibiotic medicine, take it as told by your doctor. Do not stop taking the antibiotic even if you start to feel better. General instructions  Rest.  Drink enough fluids to keep your pee (urine) clear or pale yellow.  Avoid smoking and secondhand smoke. If you smoke and you need help quitting, ask your doctor. Quitting will help your lungs heal faster.  Use an inhaler, cool mist vaporizer, or humidifier as told by your doctor.  Keep all follow-up visits as told by your doctor. This is important. How is this prevented? To lower your risk of getting this condition again:  Wash your hands often with soap and water. If you cannot use soap and water, use hand sanitizer.  Avoid contact with people who have cold symptoms.  Try not to touch your hands to your mouth, nose, or eyes.  Make sure to get the flu shot every year.  Contact a doctor if:  Your symptoms do not get better in 2 weeks. Get help right away if:  You cough up blood.  You have chest pain.  You  have very bad shortness of breath.  You become dehydrated.  You faint (pass out) or keep feeling like you are going to pass out.  You keep throwing up (vomiting).  You have a very bad headache.  Your fever or chills gets worse. This information is not intended to replace advice given to you by your health care provider. Make sure you discuss any questions you have with your health care provider. Document Released: 06/16/2007 Document Revised: 08/06/2015 Document Reviewed: 06/18/2015 Elsevier Interactive Patient Education  2017 [employer]. Drug  How to Take Your Blood Pressure You can take your blood pressure at home with a machine. You may need to check your blood pressure at home:  To check if you have high blood pressure (hypertension).  To check your blood pressure over time.  To make sure your blood pressure medicine is working.  Supplies needed: You will need a blood pressure machine, or monitor. You can buy one at a drugstore or online. When choosing one:  Choose one with an arm cuff.  Choose one that wraps around your upper arm. Only one finger should fit between your arm and the cuff.  Do not choose one that measures your blood pressure from your wrist or finger.  Your doctor can suggest a monitor. How to prepare Avoid these things for 30 minutes before checking your blood pressure:  Drinking caffeine.  Drinking alcohol.  Eating.  Smoking.  Exercising.  Five minutes before checking your blood pressure:  Pee.  Sit in a dining chair. Avoid sitting in a soft couch or armchair.  Be quiet. Do not talk.  How to take your blood pressure Follow the instructions that came with your machine. If you have a digital blood pressure monitor, these may be the instructions: 1. Sit up straight. 2. Place your feet on the floor. Do not cross your ankles or legs. 3. Rest your left arm at the level of your heart. You may rest it on a table, desk, or chair. 4. Pull up  your shirt sleeve. 5. Wrap the blood pressure cuff around the upper part of your left arm. The cuff should be 1 inch (2.5 cm) above your elbow. It is best to wrap the cuff around bare skin. 6. Fit the cuff snugly around your arm. You should be able to place only one finger between the cuff and your arm. 7. Put the cord inside the groove of your elbow. 8. Press the power button. 9. Sit quietly while the cuff fills with air and loses air. 10. Write  down the numbers on the screen. 11. Wait 2-3 minutes and then repeat steps 1-10.  What do the numbers mean? Two numbers make up your blood pressure. The first number is called systolic pressure. The second is called diastolic pressure. An example of a blood pressure reading is "120 over 80" (or 120/80). If you are an adult and do not have a medical condition, use this guide to find out if your blood pressure is normal: Normal  First number: below 120.  Second number: below 80. Elevated  First number: 120-129.  Second number: below 80. Hypertension stage 1  First number: 130-139.  Second number: 80-89. Hypertension stage 2  First number: 140 or above.  Second number: 68 or above. Your blood pressure is above normal even if only the top or bottom number is above normal. Follow these instructions at home:  Check your blood pressure as often as your doctor tells you to.  Take your monitor to your next doctor's appointment. Your doctor will: ? Make sure you are using it correctly. ? Make sure it is working right.  Make sure you understand what your blood pressure numbers should be.  Tell your doctor if your medicines are causing side effects. Contact a doctor if:  Your blood pressure keeps being high. Get help right away if:  Your first blood pressure number is higher than 180.  Your second blood pressure number is higher than 120. This information is not intended to replace advice given to you by your health care provider.  Make sure you discuss any questions you have with your health care provider. Document Released: 12/11/2007 Document Revised: 11/26/2015 Document Reviewed: 06/06/2015 Elsevier Interactive Patient Education  2018 [employer]. Prednisone tablets What is this medicine? PREDNISONE (PRED ni sone) is a corticosteroid. It is commonly used to treat inflammation of the skin, joints, lungs, and other organs. Common conditions treated include asthma, allergies, and arthritis. It is also used for other conditions, such as blood disorders and diseases of the adrenal glands. This medicine may be used for other purposes; ask your health care provider or pharmacist if you have questions. COMMON BRAND NAME(S): Deltasone, Predone, Sterapred, Sterapred DS What should I tell my health care provider before I take this medicine? They need to know if you have any of these conditions: -Cushing's syndrome -diabetes -glaucoma -heart disease -high blood pressure -infection (especially a virus infection such as chickenpox, cold sores, or herpes) -kidney disease -liver disease -mental illness -myasthenia gravis -osteoporosis -seizures -stomach or intestine problems -thyroid disease -an unusual or allergic reaction to lactose, prednisone, other medicines, foods, dyes, or preservatives -pregnant or trying to get pregnant -breast-feeding How should I use this medicine? Take this medicine by mouth with a glass of water. Follow the directions on the prescription label. Take this medicine with food. If you are taking this medicine once a day, take it in the morning. Do not take more medicine than you are told to take. Do not suddenly stop taking your medicine because you may develop a severe reaction. Your doctor will tell you how much medicine to take. If your doctor wants you to stop the medicine, the dose may be slowly lowered over time to avoid any side effects. Talk to your pediatrician regarding the use of this  medicine in children. Special care may be needed. Overdosage: If you think you have taken too much of this medicine contact a poison control center or emergency room at once. NOTE: This medicine is only for  you. Do not share this medicine with others. What if I miss a dose? If you miss a dose, take it as soon as you can. If it is almost time for your next dose, talk to your doctor or health care professional. You may need to miss a dose or take an extra dose. Do not take double or extra doses without advice. What may interact with this medicine? Do not take this medicine with any of the following medications: -metyrapone -mifepristone This medicine may also interact with the following medications: -aminoglutethimide -amphotericin B -aspirin and aspirin-like medicines -barbiturates -certain medicines for diabetes, like glipizide or glyburide -cholestyramine -cholinesterase inhibitors -cyclosporine -digoxin -diuretics -ephedrine -male hormones, like estrogens and birth control pills -isoniazid -ketoconazole -NSAIDS, medicines for pain and inflammation, like ibuprofen or naproxen -phenytoin -rifampin -toxoids -vaccines -warfarin This list may not describe all possible interactions. Give your health care provider a list of all the medicines, herbs, non-prescription drugs, or dietary supplements you use. Also tell them if you smoke, drink alcohol, or use illegal drugs. Some items may interact with your medicine. What should I watch for while using this medicine? Visit your doctor or health care professional for regular checks on your progress. If you are taking this medicine over a prolonged period, carry an identification card with your name and address, the type and dose of your medicine, and your doctor's name and address. This medicine may increase your risk of getting an infection. Tell your doctor or health care professional if you are around anyone with measles or chickenpox, or  if you develop sores or blisters that do not heal properly. If you are going to have surgery, tell your doctor or health care professional that you have taken this medicine within the last twelve months. Ask your doctor or health care professional about your diet. You may need to lower the amount of salt you eat. This medicine may affect blood sugar levels. If you have diabetes, check with your doctor or health care professional before you change your diet or the dose of your diabetic medicine. What side effects may I notice from receiving this medicine? Side effects that you should report to your doctor or health care professional as soon as possible: -allergic reactions like skin rash, itching or hives, swelling of the face, lips, or tongue -changes in emotions or moods -changes in vision -depressed mood -eye pain -fever or chills, cough, sore throat, pain or difficulty passing urine -increased thirst -swelling of ankles, feet Side effects that usually do not require medical attention (report to your doctor or health care professional if they continue or are bothersome): -confusion, excitement, restlessness -headache -nausea, vomiting -skin problems, acne, thin and shiny skin -trouble sleeping -weight gain This list may not describe all possible side effects. Call your doctor for medical advice about side effects. You may report side effects to FDA at 1-800-FDA-1088. Where should I keep my medicine? Keep out of the reach of children. Store at room temperature between 15 and 30 degrees C (59 and 86 degrees F). Protect from light. Keep container tightly closed. Throw away any unused medicine after the expiration date. NOTE: This sheet is a summary. It may not cover all possible information. If you have questions about this medicine, talk to your doctor, pharmacist, or health care provider.  2018 Elsevier/Gold Standard (2010-08-13 10:57:14) Amoxicillin; Clavulanic Acid chewable  tablets What is this medicine? AMOXICILLIN; CLAVULANIC ACID (a mox i SIL in; KLAV yoo lan ic AS id) is a penicillin  antibiotic. It is used to treat certain kinds of bacterial infections. It It will not work for colds, flu, or other viral infections. This medicine may be used for other purposes; ask your health care provider or pharmacist if you have questions. COMMON BRAND NAME(S): Augmentin What should I tell my health care provider before I take this medicine? They need to know if you have any of these conditions: -bowel disease, like colitis -kidney disease -liver disease -mononucleosis -phenylketonuria -an unusual or allergic reaction to amoxicillin, penicillin, cephalosporin, other antibiotics, clavulanic acid, other medicines, foods, dyes, or preservatives -pregnant or trying to get pregnant -breast-feeding How should I use this medicine? Take this medicine by mouth. Chew it completely before swallowing. Follow the directions on the prescription label. Take this medicine at the start of a meal or snack. Take your medicine at regular intervals. Do not take your medicine more often than directed. Take all of your medicine as directed even if you think you are better. Do not skip doses or stop your medicine early. Talk to your pediatrician regarding the use of this medicine in children. While this drug may be prescribed for selected conditions, precautions do apply. Overdosage: If you think you have taken too much of this medicine contact a poison control center or emergency room at once. NOTE: This medicine is only for you. Do not share this medicine with others. What if I miss a dose? If you miss a dose, take it as soon as you can. If it is almost time for your next dose, take only that dose. Do not take double or extra doses. What may interact with this medicine? -allopurinol -anticoagulants -birth control pills -methotrexate -probenecid This list may not describe all possible  interactions. Give your health care provider a list of all the medicines, herbs, non-prescription drugs, or dietary supplements you use. Also tell them if you smoke, drink alcohol, or use illegal drugs. Some items may interact with your medicine. What should I watch for while using this medicine? Tell your doctor or health care professional if your symptoms do not improve. Do not treat diarrhea with over the counter products. Contact your doctor if you have diarrhea that lasts more than 2 days or if it is severe and watery. If you have diabetes, you may get a false-positive result for sugar in your urine. Check with your doctor or health care professional. Birth control pills may not work properly while you are taking this medicine. Talk to your doctor about using an extra method of birth control. What side effects may I notice from receiving this medicine? Side effects that you should report to your doctor or health care professional as soon as possible: -allergic reactions like skin rash, itching or hives, swelling of the face, lips, or tongue -breathing problems -dark urine -fever or chills, sore throat -redness, blistering, peeling or loosening of the skin, including inside the mouth -seizures -trouble passing urine or change in the amount of urine -unusual bleeding, bruising -unusually weak or tired -white patches or sores in the mouth or throat Side effects that usually do not require medical attention (report to your doctor or health care professional if they continue or are bothersome): -diarrhea -dizziness -headache -nausea, vomiting -stomach upset -vaginal or anal irritation This list may not describe all possible side effects. Call your doctor for medical advice about side effects. You may report side effects to FDA at 1-800-FDA-1088. Where should I keep my medicine? Keep out of the reach of children. Store  at room temperature below 25 degrees C (77 degrees F). Keep container  tightly closed. Throw away any unused medicine after the expiration date. NOTE: This sheet is a summary. It may not cover all possible information. If you have questions about this medicine, talk to your doctor, pharmacist, or health care provider.  2018 Elsevier/Gold Standard (2007-03-23 11:38:22) Albuterol inhalation aerosol What is this medicine? ALBUTEROL (al [place]) is a bronchodilator. It helps open up the airways in your lungs to make it easier to breathe. This medicine is used to treat and to prevent bronchospasm. This medicine may be used for other purposes; ask your health care provider or pharmacist if you have questions. COMMON BRAND NAME(S): Proair HFA, Proventil, Proventil HFA, Respirol, Ventolin, Ventolin HFA What should I tell my health care provider before I take this medicine? They need to know if you have any of the following conditions: -diabetes -heart disease or irregular heartbeat -high blood pressure -pheochromocytoma -seizures -thyroid disease -an unusual or allergic reaction to albuterol, levalbuterol, sulfites, other medicines, foods, dyes, or preservatives -pregnant or trying to get pregnant -breast-feeding How should I use this medicine? This medicine is for inhalation through the mouth. Follow the directions on your prescription label. Take your medicine at regular intervals. Do not use more often than directed. Make sure that you are using your inhaler correctly. Ask you doctor or health care provider if you have any questions. Talk to your pediatrician regarding the use of this medicine in children. Special care may be needed. Overdosage: If you think you have taken too much of this medicine contact a poison control center or emergency room at once. NOTE: This medicine is only for you. Do not share this medicine with others. What if I miss a dose? If you miss a dose, use it as soon as you can. If it is almost time for your next dose, use only that dose.  Do not use double or extra doses. What may interact with this medicine? -anti-infectives like chloroquine and pentamidine -caffeine -cisapride -diuretics -medicines for colds -medicines for depression or for emotional or psychotic conditions -medicines for weight loss including some herbal products -methadone -some antibiotics like clarithromycin, erythromycin, levofloxacin, and linezolid -some heart medicines -steroid hormones like dexamethasone, cortisone, hydrocortisone -theophylline -thyroid hormones This list may not describe all possible interactions. Give your health care provider a list of all the medicines, herbs, non-prescription drugs, or dietary supplements you use. Also tell them if you smoke, drink alcohol, or use illegal drugs. Some items may interact with your medicine. What should I watch for while using this medicine? Tell your doctor or health care professional if your symptoms do not improve. Do not use extra albuterol. If your asthma or bronchitis gets worse while you are using this medicine, call your doctor right away. If your mouth gets dry try chewing sugarless gum or sucking hard candy. Drink water as directed. What side effects may I notice from receiving this medicine? Side effects that you should report to your doctor or health care professional as soon as possible: -allergic reactions like skin rash, itching or hives, swelling of the face, lips, or tongue -breathing problems -chest pain -feeling faint or lightheaded, falls -high blood pressure -irregular heartbeat -fever -muscle cramps or weakness -pain, tingling, numbness in the hands or feet -vomiting Side effects that usually do not require medical attention (report to your doctor or health care professional if they continue or are bothersome): -cough -difficulty sleeping -headache -nervousness or trembling -  stomach upset -stuffy or runny nose -throat irritation -unusual taste This list may not  describe all possible side effects. Call your doctor for medical advice about side effects. You may report side effects to FDA at 1-800-FDA-1088. Where should I keep my medicine? Keep out of the reach of children. Store at room temperature between 15 and 30 degrees C (59 and 86 degrees F). The contents are under pressure and may burst when exposed to heat or flame. Do not freeze. This medicine does not work as well if it is too cold. Throw away any unused medicine after the expiration date. Inhalers need to be thrown away after the labeled number of puffs have been used or by the expiration date; whichever comes first. Ventolin HFA should be thrown away 12 months after removing from foil pouch. Check the instructions that come with your medicine. NOTE: This sheet is a summary. It may not cover all possible information. If you have questions about this medicine, talk to your doctor, pharmacist, or health care provider.  2018 Elsevier/Gold Standard (2012-06-15 10:57:17)

## 2016-09-08 NOTE — Progress Notes (Signed)
Subjective:     Patient ID: Benjamin Ball, male   DOB: 1965-07-25, 51 y.o.   MRN: [id number]  HPI  Patient is a 51 year old male with in no acute distress who complains of nasal and chest congestion. He reports he has been around mold/ mildew with his job. He denies any fevers. He reports symptoms have been present x 2 weeks.   Daytime Severe Daytime he has been taking for the past two weeks. He denies any other symptoms.   Blood pressure (!) 148/86, pulse 78, temperature 98.8 F (37.1 C), temperature source Tympanic, weight 157 lb 6.4 oz (71.4 kg), SpO2 96 %. Repeat blood pressure recheck  130/78   Patient Active Problem List   Diagnosis Date Noted  . Spondylolisthesis, congenital 06/18/2016  . Spermatocele of epididymis, single 03/10/2016  . Epididymal cyst 02/18/2015  . Testicular pain 02/18/2015  . S/P lumbar spinal fusion 12/18/2013  . Scrotal mass 12/18/2013  . Tobacco abuse 12/18/2013  . Eczema, dyshidrotic 07/20/2012  . Vitamin D deficiency 07/20/2012  . Hyperlipidemia   . Backache    Allergies  Allergen Reactions  . Latex     WEARING GLOVES FOR AN EXTENDED PERIOD OF TIME     Review of Systems  Constitutional: Positive for appetite change, chills (occasionally at night ) and fatigue (tired at times but has been very busy at work). Negative for activity change, diaphoresis, fever and unexpected weight change.  HENT: Positive for congestion, ear pain (itching as well ), sinus pain (frontal ), sinus pressure and sneezing. Negative for dental problem, drooling, ear discharge, facial swelling, hearing loss, mouth sores, nosebleeds, postnasal drip, rhinorrhea, sore throat, tinnitus, trouble swallowing and voice change.   Respiratory: Positive for chest tightness (mild intermittent with humidity outside) and wheezing (at night has been mild ). Negative for apnea, cough, choking and stridor.   Cardiovascular: Negative for chest pain, palpitations and leg swelling.   Gastrointestinal: Negative for abdominal distention, abdominal pain, anal bleeding, blood in stool, constipation, diarrhea, nausea, rectal pain and vomiting.  Endocrine: Negative.   Genitourinary: Negative for decreased urine volume, difficulty urinating, discharge, dysuria, enuresis, flank pain, frequency, genital sores, hematuria, penile pain, penile swelling, scrotal swelling, testicular pain and urgency.  Musculoskeletal: Positive for back pain (chronic no change ). Negative for arthralgias, gait problem, joint swelling, myalgias, neck pain and neck stiffness.  Allergic/Immunologic: Negative for environmental allergies, food allergies and immunocompromised state.  Neurological: Negative.   Hematological: Negative.   Psychiatric/Behavioral: Negative.        Objective:   Physical Exam  Constitutional: He is oriented to person, place, and time. Vital signs are normal. He appears well-developed and well-nourished. He is active.  Non-toxic appearance. He does not have a sickly appearance. He does not appear ill. No distress.  HENT:  Head: Normocephalic and atraumatic.  Right Ear: Hearing, external ear and ear canal normal. A middle ear effusion is present.  Left Ear: Hearing, external ear and ear canal normal. A middle ear effusion is present.  Nose: Mucosal edema and rhinorrhea present.  Mouth/Throat: Uvula is midline and mucous membranes are normal. No oropharyngeal exudate, posterior oropharyngeal edema, posterior oropharyngeal erythema or tonsillar abscesses.  Postnasal drip/yellowp clear noted  Eyes: Pupils are equal, round, and reactive to light. Conjunctivae and EOM are normal. Right eye exhibits no discharge. Left eye exhibits no discharge. No scleral icterus.  Neck: Normal range of motion. Neck supple. No JVD present. No tracheal deviation present. No thyromegaly present.  Cardiovascular:  Normal rate, regular rhythm, normal heart sounds and intact distal pulses.  Exam reveals no  gallop and no friction rub.   No murmur heard. Pulmonary/Chest: Effort normal. No stridor. No respiratory distress. He has wheezes (mild expiratory wheeze upper lobes bilaterally) in the right upper field and the left upper field. He has no rales. Chest wall is not dull to percussion. He exhibits no mass, no tenderness, no bony tenderness, no laceration, no crepitus, no edema, no deformity, no swelling and no retraction.  Abdominal: Soft. Bowel sounds are normal.  Musculoskeletal: Normal range of motion.  Patient moves on and off exam tables without any difficulty. Gait sure and steady in hall and in room.   Lymphadenopathy:    He has no cervical adenopathy.  Neurological: He is alert and oriented to person, place, and time. He has normal reflexes. He displays normal reflexes. No cranial nerve deficit. He exhibits normal muscle tone. Coordination normal.  Engages in normal conversation.   Skin: Skin is warm, dry and intact. No rash noted. He is not diaphoretic. No erythema. No pallor.       Assessment:     Bronchitis  Acute non-recurrent frontal sinusitis      Plan:     Meds ordered this encounter  Medications  . dextromethorphan 15 MG/5ML syrup    Sig: Take 10 mLs by mouth 3 (three) times daily as needed for cough.  Marland Kitchen amoxicillin-clavulanate (AUGMENTIN) 875-125 MG tablet    Sig: Take 1 tablet by mouth 2 (two) times daily.    Dispense:  20 tablet    Refill:  0  . albuterol (PROVENTIL HFA;VENTOLIN HFA) 108 (90 Base) MCG/ACT inhaler    Sig: Inhale 2 puffs into the lungs every 6 (six) hours as needed for wheezing or shortness of breath.    Dispense:  1 Inhaler    Refill:  2  . predniSONE (STERAPRED UNI-PAK 21 TAB) 10 MG (21) TBPK tablet    Sig: Take 6 tablets on day one5 tablets day two 4 tablets day three 3 tablets day four 2 tablets day five 1 tablet day six    Dispense:  21 tablet    Refill:  0   Take medications as prescribed above. E- Prescribed to Pharmacy on file.    Discontinue Sudafed and monitor blood pressure at home report any abnormal readings per handout guidelines.   Return to clinic at any time  if any new symptoms change, worsen or do not improve. Symptoms should improve  within 72 hours and if not improving you should call for an appointment at the clinic or if worsening symptoms be seen in urgent care/emergency room  if clinic is closed. If any emergent symptoms call 911 at anytime. Patient verbalized above understanding of all instructions and denies any further questions at this time.  Follow up with your PCP below for a primary care visit and follow up. Patient verbalized understanding.    Lucille Passy, MD

## 2016-09-10 ENCOUNTER — Encounter: Payer: Self-pay | Admitting: Emergency Medicine

## 2016-09-10 ENCOUNTER — Emergency Department
Admission: EM | Admit: 2016-09-10 | Discharge: 2016-09-10 | Disposition: A | Discharge status: Home / Self Care | Payer: Worker's Compensation | Attending: Student in an Organized Health Care Education/Training Program | Admitting: Student in an Organized Health Care Education/Training Program

## 2016-09-10 DIAGNOSIS — Y9389 Activity, other specified: Secondary | ICD-10-CM | POA: Diagnosis not present

## 2016-09-10 DIAGNOSIS — R42 Dizziness and giddiness: Secondary | ICD-10-CM | POA: Diagnosis not present

## 2016-09-10 DIAGNOSIS — Z91040 Latex allergy status: Secondary | ICD-10-CM | POA: Insufficient documentation

## 2016-09-10 DIAGNOSIS — Y929 Unspecified place or not applicable: Secondary | ICD-10-CM | POA: Diagnosis not present

## 2016-09-10 DIAGNOSIS — T671XXA Heat syncope, initial encounter: Secondary | ICD-10-CM | POA: Diagnosis not present

## 2016-09-10 DIAGNOSIS — F17210 Nicotine dependence, cigarettes, uncomplicated: Secondary | ICD-10-CM | POA: Insufficient documentation

## 2016-09-10 DIAGNOSIS — X30XXXA Exposure to excessive natural heat, initial encounter: Secondary | ICD-10-CM | POA: Insufficient documentation

## 2016-09-10 DIAGNOSIS — T675XXA Heat exhaustion, unspecified, initial encounter: Secondary | ICD-10-CM | POA: Diagnosis not present

## 2016-09-10 DIAGNOSIS — Y998 Other external cause status: Secondary | ICD-10-CM | POA: Diagnosis not present

## 2016-09-10 LAB — BASIC METABOLIC PANEL
Anion gap: 9 (ref 5–15)
BUN: 18 mg/dL (ref 6–20)
CHLORIDE: 107 mmol/L (ref 101–111)
CO2: 24 mmol/L (ref 22–32)
CREATININE: 0.94 mg/dL (ref 0.61–1.24)
Calcium: 9.3 mg/dL (ref 8.9–10.3)
GFR calc Af Amer: >60 mL/min (ref >60)
GFR calc non Af Amer: >60 mL/min (ref >60)
Glucose, Bld: 121 mg/dL — ABNORMAL HIGH (ref 65–99)
Potassium: 3.9 mmol/L (ref 3.5–5.1)
Sodium: 140 mmol/L (ref 135–145)

## 2016-09-10 LAB — CBC
HCT: 41.6 % (ref 40.0–52.0)
Hemoglobin: 14.3 g/dL (ref 13.0–18.0)
MCH: 32.3 pg (ref 26.0–34.0)
MCHC: 34.3 g/dL (ref 32.0–36.0)
MCV: 94.1 fL (ref 80.0–100.0)
PLATELETS: 215 10*3/uL (ref 150–440)
RBC: 4.41 MIL/uL (ref 4.40–5.90)
RDW: 13.6 % (ref 11.5–14.5)
WBC: 10 10*3/uL (ref 3.8–10.6)

## 2016-09-10 MED ORDER — SODIUM CHLORIDE 0.9 % IV BOLUS (SEPSIS)
1000.0000 mL | Freq: Once | INTRAVENOUS | Status: AC
  Administered 2016-09-10: 1000 mL via INTRAVENOUS

## 2016-09-10 NOTE — ED Provider Notes (Signed)
[facility] Emergency Department Provider Note    First MD Initiated Contact with Patient 09/10/16 1724     (approximate)  I have reviewed the triage vital signs and the nursing notes.   HISTORY  Chief Complaint Heat Exposure    HPI Benjamin Ball is a 51 y.o. male who presents with a chief complaint of overheated today while patient was pressure washing and had not had much to drink. Patient states that he can feel himself getting lightheaded and dizzy and feeling hot and that he needed to go inside. He did grab some CT to try and hydrate himself but started feeling more lightheaded. He then went to sit down and before meals room and started feeling weak and started shaking. Did not fully lose consciousness. EMS was called and they started active cooling measures with improvement in his symptoms. Patient was given a 500 cc bolus of IV fluids. He denies any chest pain or shortness of breath. No history of heart disease. He does have a history of reported COPD and continues to smoke.   Past Medical History:  Diagnosis Date  . Arthritis   . Boil 04/09/2016   LEG-PT ENCOURAGED TO GET PCP TO LOOK AT LEG BEFORE UPCOMING SURGERY  . Chronic back pain    SCIATICA  . Cold   . GERD (gastroesophageal reflux disease)    occasional-TUMS PRN  . Heartburn   . Hyperlipidemia    Family History  Problem Relation Age of Onset  . Heart disease Father   . Colon cancer Neg Hx   . Rectal cancer Neg Hx   . Stomach cancer Neg Hx    Past Surgical History:  Procedure Laterality Date  . BACK SURGERY    . HAND SURGERY Right   . INGUINAL HERNIA REPAIR     age 15 or so  . KNEE SURGERY    . SPERMATOCELECTOMY Right 04/19/2016   Procedure: SPERMATOCELECTOMY;  Surgeon: Hollice Espy, MD;  Location: [facility] ORS;  Service: Urology;  Laterality: Right;  . WISDOM TOOTH EXTRACTION     Patient Active Problem List   Diagnosis Date Noted  . Spondylolisthesis, congenital 06/18/2016    . Spermatocele of epididymis, single 03/10/2016  . Epididymal cyst 02/18/2015  . Testicular pain 02/18/2015  . S/P lumbar spinal fusion 12/18/2013  . Scrotal mass 12/18/2013  . Tobacco abuse 12/18/2013  . Eczema, dyshidrotic 07/20/2012  . Vitamin D deficiency 07/20/2012  . Hyperlipidemia   . Backache       Prior to Admission medications   Medication Sig Start Date End Date Taking? Authorizing Provider  acetaminophen (TYLENOL) 500 MG tablet Take 1,000 mg by mouth 4 (four) times daily as needed. 11/29/12   [provider]  albuterol (PROVENTIL HFA;VENTOLIN HFA) 108 (90 Base) MCG/ACT inhaler Inhale 2 puffs into the lungs every 6 (six) hours as needed for wheezing or shortness of breath. 09/08/16   Flinchum, Kelby Aline, FNP  amoxicillin-clavulanate (AUGMENTIN) 875-125 MG tablet Take 1 tablet by mouth 2 (two) times daily. 09/08/16 09/18/16  Flinchum, Kelby Aline, FNP  calcium carbonate (TUMS - DOSED IN MG ELEMENTAL CALCIUM) 500 MG chewable tablet Chew 1 tablet by mouth as needed for indigestion or heartburn.    [provider]  dextromethorphan 15 MG/5ML syrup Take 10 mLs by mouth 3 (three) times daily as needed for cough.    [provider]  ibuprofen (ADVIL,MOTRIN) 200 MG tablet Take 600-800 mg by mouth every 6 (six) hours as needed for  moderate pain.    [provider]  lidocaine (LIDODERM) 5 % Place 1 patch onto the skin daily. Remove & Discard patch within 12 hours or as directed by MD Patient not taking: Reported on 09/08/2016 06/18/16   Betancourt, Aura Fey, NP  predniSONE (STERAPRED UNI-PAK 21 TAB) 10 MG (21) TBPK tablet Take 6 tablets on day one5 tablets day two 4 tablets day three 3 tablets day four 2 tablets day five 1 tablet day six 09/08/16   Flinchum, Kelby Aline, FNP    Allergies Latex    Social History Social History  Substance Use Topics  . Smoking status: Current Every Day Smoker    Packs/day: 1.00    Years: 36.00    Types: Cigarettes  .  Smokeless tobacco: Never Used  . Alcohol use 4.2 oz/week    7 Standard drinks or equivalent per week     Comment: beer, wine, liquor -2/3 BEERS/DAY    Review of Systems Patient denies headaches, rhinorrhea, blurry vision, numbness, shortness of breath, chest pain, edema, cough, abdominal pain, nausea, vomiting, diarrhea, dysuria, fevers, rashes or hallucinations unless otherwise stated above in HPI. ____________________________________________   PHYSICAL EXAM:  VITAL SIGNS: Vitals:   09/10/16 1719  Pulse: 61  Resp: 16  Temp: 98.5 F (36.9 C)  SpO2: 99%    Constitutional: Alert and oriented. Well appearing and in no acute distress. Eyes: Conjunctivae are normal.  Head: Atraumatic. Nose: No congestion/rhinnorhea. Mouth/Throat: Mucous membranes are moist.   Neck: No stridor. Painless ROM.  Cardiovascular: Normal rate, regular rhythm. Grossly normal heart sounds.  Good peripheral circulation. Respiratory: Normal respiratory effort.  No retractions. Lungs CTAB. Gastrointestinal: Soft and nontender. No distention. No abdominal bruits. No CVA tenderness. Genitourinary:  Musculoskeletal: No lower extremity tenderness nor edema.  No joint effusions. Neurologic:  Normal speech and language. No gross focal neurologic deficits are appreciated. No facial droop Skin:  Skin is warm, dry and intact. No rash noted. Psychiatric: Mood and affect are normal. Speech and behavior are normal.  ____________________________________________   LABS (all labs ordered are listed, but only abnormal results are displayed)  Results for orders placed or performed during the hospital encounter of 09/10/16 (from the past 24 hour(s))  Basic metabolic panel     Status: Abnormal   Collection Time: 09/10/16  5:29 PM  Result Value Ref Range   Sodium 140 135 - 145 mmol/L   Potassium 3.9 3.5 - 5.1 mmol/L   Chloride 107 101 - 111 mmol/L   CO2 24 22 - 32 mmol/L   Glucose, Bld 121 (H) 65 - 99 mg/dL   BUN 18 6  - 20 mg/dL   Creatinine, Ser 0.94 0.61 - 1.24 mg/dL   Calcium 9.3 8.9 - 10.3 mg/dL   GFR calc non Af Amer >60 >60 mL/min   GFR calc Af Amer >60 >60 mL/min   Anion gap 9 5 - 15  CBC     Status: None   Collection Time: 09/10/16  5:29 PM  Result Value Ref Range   WBC 10.0 3.8 - 10.6 K/uL   RBC 4.41 4.40 - 5.90 MIL/uL   Hemoglobin 14.3 13.0 - 18.0 g/dL   HCT 41.6 40.0 - 52.0 %   MCV 94.1 80.0 - 100.0 fL   MCH 32.3 26.0 - 34.0 pg   MCHC 34.3 32.0 - 36.0 g/dL   RDW 13.6 11.5 - 14.5 %   Platelets 215 150 - 440 K/uL   ____________________________________________  EKG My review and personal  interpretation at Time: 17:26   Indication: heat illness  Rate: 60  Rhythm: sinus Axis: normal Other: normal intervals, no stemi ____________________________________________  RADIOLOGY  ____________________________________________   PROCEDURES  Procedure(s) performed:  Procedures    Critical Care performed: no ____________________________________________   INITIAL IMPRESSION / ASSESSMENT AND PLAN / ED COURSE  Pertinent labs & imaging results that were available during my care of the patient were reviewed by me and considered in my medical decision making (see chart for details).  DDX: dehydration,. Heat syncope, heat illness, aki, dysrhythmia  SHAD LEDVINA is a 51 y.o. who presents to the ED with heat illness as described above. He is currently well appearing and in no acute distress. Blood work is reassuring. He has no focal neurodeficits.  No chest pain or shortness of breath. EKG is nonischemic. Patient given IV fluids for dehydration. He is tolerating oral hydration able to ambulate with a steady gait. Patient  appropriate for follow-up with PCP.        ____________________________________________   FINAL CLINICAL IMPRESSION(S) / ED DIAGNOSES  Final diagnoses:  Heat syncope, initial encounter      NEW MEDICATIONS STARTED DURING THIS VISIT:  New Prescriptions    No medications on file     Note:  This document was prepared using Dragon voice recognition software and may include unintentional dictation errors.    Merlyn Lot, MD 09/10/16 [phone]

## 2016-09-10 NOTE — ED Triage Notes (Signed)
Patient presents from work via [employer]. Reports he was outside working and started getting lightheaded and dizzy. Reports he has not had much to drink today and feels like he over did it. EMS reports upon their arrival patient was diaphoretic and disoriented. Upon arrival patient states he is feeling better and is A&O x4.

## 2016-11-26 ENCOUNTER — Ambulatory Visit: Payer: Self-pay | Admitting: Adult Health

## 2016-11-26 ENCOUNTER — Ambulatory Visit
Admission: RE | Admit: 2016-11-26 | Discharge: 2016-11-26 | Disposition: A | Discharge status: Home / Self Care | Payer: BLUE CROSS/BLUE SHIELD | Source: Ambulatory Visit | Attending: Adult Health | Admitting: Adult Health

## 2016-11-26 ENCOUNTER — Encounter: Payer: Self-pay | Admitting: Adult Health

## 2016-11-26 ENCOUNTER — Telehealth: Payer: Self-pay

## 2016-11-26 VITALS — BP 108/72 | HR 91 | Temp 98.4°F | Resp 16 | Wt 160.0 lb

## 2016-11-26 DIAGNOSIS — R059 Cough, unspecified: Secondary | ICD-10-CM

## 2016-11-26 DIAGNOSIS — F17200 Nicotine dependence, unspecified, uncomplicated: Secondary | ICD-10-CM

## 2016-11-26 DIAGNOSIS — Z026 Encounter for examination for insurance purposes: Secondary | ICD-10-CM

## 2016-11-26 DIAGNOSIS — R05 Cough: Secondary | ICD-10-CM

## 2016-11-26 DIAGNOSIS — R062 Wheezing: Secondary | ICD-10-CM | POA: Diagnosis not present

## 2016-11-26 DIAGNOSIS — Z716 Tobacco abuse counseling: Secondary | ICD-10-CM

## 2016-11-26 DIAGNOSIS — F17209 Nicotine dependence, unspecified, with unspecified nicotine-induced disorders: Secondary | ICD-10-CM

## 2016-11-26 DIAGNOSIS — Z77120 Contact with and (suspected) exposure to mold (toxic): Secondary | ICD-10-CM

## 2016-11-26 MED ORDER — ALBUTEROL SULFATE HFA 108 (90 BASE) MCG/ACT IN AERS: 2.0000 | INHALATION_SPRAY | Freq: Four times a day (QID) | RESPIRATORY_TRACT | 1 refills | Status: DC | PRN

## 2016-11-26 MED ORDER — IPRATROPIUM-ALBUTEROL 0.5-2.5 (3) MG/3ML IN SOLN: 3.0000 mL | Freq: Once | RESPIRATORY_TRACT | Status: DC

## 2016-11-26 MED ORDER — LEVOFLOXACIN 500 MG PO TABS: 500.0000 mg | ORAL_TABLET | Freq: Every day | ORAL | 0 refills | Status: DC

## 2016-11-26 MED ORDER — PREDNISONE 10 MG (21) PO TBPK: ORAL_TABLET | ORAL | 0 refills | Status: DC

## 2016-11-26 NOTE — Telephone Encounter (Signed)
Pt notified that chest xray results was normal.  He is to continue medications as prescribed and follow up with [place] ([place] next week.  Patient verbalizes understanding.

## 2016-11-26 NOTE — Patient Instructions (Addendum)
Cough, Adult A cough helps to clear your throat and lungs. A cough may last only 2-3 weeks (acute), or it may last longer than 8 weeks (chronic). Many different things can cause a cough. A cough may be a sign of an illness or another medical condition. Follow these instructions at home:  Pay attention to any changes in your cough.  Take medicines only as told by your doctor. ? If you were prescribed an antibiotic medicine, take it as told by your doctor. Do not stop taking it even if you start to feel better. ? Talk with your doctor before you try using a cough medicine.  Drink enough fluid to keep your pee (urine) clear or pale yellow.  If the air is dry, use a cold steam vaporizer or humidifier in your home.  Stay away from things that make you cough at work or at home.  If your cough is worse at night, try using extra pillows to raise your head up higher while you sleep.  Do not smoke, and try not to be around smoke. If you need help quitting, ask your doctor.  Do not have caffeine.  Do not drink alcohol.  Rest as needed. Contact a doctor if:  You have new problems (symptoms).  You cough up yellow fluid (pus).  Your cough does not get better after 2-3 weeks, or your cough gets worse.  Medicine does not help your cough and you are not sleeping well.  You have pain that gets worse or pain that is not helped with medicine.  You have a fever.  You are losing weight and you do not know why.  You have night sweats. Get help right away if:  You cough up blood.  You have trouble breathing.  Your heartbeat is very fast. This information is not intended to replace advice given to you by your health care provider. Make sure you discuss any questions you have with your health care provider. Document Released: 09/10/2010 Document Revised: 06/05/2015 Document Reviewed: 03/06/2014 Elsevier Interactive Patient Education  2018 [employer]. Sinus Rinse What is a sinus  rinse? A sinus rinse is a simple home treatment that is used to rinse your sinuses with a sterile mixture of salt and water (saline solution). Sinuses are air-filled spaces in your skull behind the bones of your face and forehead that open into your nasal cavity. You will use the following:  Saline solution.  Neti pot or spray bottle. This releases the saline solution into your nose and through your sinuses. Neti pots and spray bottles can be purchased at [employer], a health food store, or online.  When would I do a sinus rinse? A sinus rinse can help to clear mucus, dirt, dust, or pollen from the nasal cavity. You may do a sinus rinse when you have a cold, a virus, nasal allergy symptoms, a sinus infection, or stuffiness in the nose or sinuses. If you are considering a sinus rinse:  Ask your child's health care provider before performing a sinus rinse on your child.  Do not do a sinus rinse if you have had ear or nasal surgery, ear infection, or blocked ears.  How do I do a sinus rinse?  Wash your hands.  Disinfect your device according to the directions provided and then dry it.  Use the solution that comes with your device or one that is sold separately in stores. Follow the mixing directions on the package.  Fill your device with the  amount of saline solution as directed by the device instructions.  Stand over a sink and tilt your head sideways over the sink.  Place the spout of the device in your upper nostril (the one closer to the ceiling).  Gently pour or squeeze the saline solution into the nasal cavity. The liquid should drain to the lower nostril if you are not overly congested.  Gently blow your nose. Blowing too hard may cause ear pain.  Repeat in the other nostril.  Clean and rinse your device with clean water and then air-dry it. Are there risks of a sinus rinse? Sinus rinse is generally very safe and effective. However, there are a few risks, which  include:  A burning sensation in the sinuses. This may happen if you do not make the saline solution as directed. Make sure to follow all directions when making the saline solution.  Infection from contaminated water. This is rare, but possible.  Nasal irritation.  This information is not intended to replace advice given to you by your health care provider. Make sure you discuss any questions you have with your health care provider. Document Released: 07/25/2013 Document Revised: 11/25/2015 Document Reviewed: 05/15/2013 Elsevier Interactive Patient Education  2017 [place].  Upper Respiratory Infection, Adult Most upper respiratory infections (URIs) are caused by a virus. A URI affects the nose, throat, and upper air passages. The most common type of URI is often called "the common cold." Follow these instructions at home:  Take medicines only as told by your doctor.  Gargle warm saltwater or take cough drops to comfort your throat as told by your doctor.  Use a warm mist humidifier or inhale steam from a shower to increase air moisture. This may make it easier to breathe.  Drink enough fluid to keep your pee (urine) clear or pale yellow.  Eat soups and other clear broths.  Have a healthy diet.  Rest as needed.  Go back to work when your fever is gone or your doctor says it is okay. ? You may need to stay home longer to avoid giving your URI to others. ? You can also wear a face mask and wash your hands often to prevent spread of the virus.  Use your inhaler more if you have asthma.  Do not use any tobacco products, including cigarettes, chewing tobacco, or electronic cigarettes. If you need help quitting, ask your doctor. Contact a doctor if:  You are getting worse, not better.  Your symptoms are not helped by medicine.  You have chills.  You are getting more short of breath.  You have brown or red mucus.  You have yellow or brown discharge from your  nose.  You have pain in your face, especially when you bend forward.  You have a fever.  You have puffy (swollen) neck glands.  You have pain while swallowing.  You have white areas in the back of your throat. Get help right away if:  You have very bad or constant: ? Headache. ? Ear pain. ? Pain in your forehead, behind your eyes, and over your cheekbones (sinus pain). ? Chest pain.  You have long-lasting (chronic) lung disease and any of the following: ? Wheezing. ? Long-lasting cough. ? Coughing up blood. ? A change in your usual mucus.  You have a stiff neck.  You have changes in your: ? Vision. ? Hearing. ? Thinking. ? Mood. This information is not intended to replace advice given to you by your health  care provider. Make sure you discuss any questions you have with your health care provider. Document Released: 06/16/2007 Document Revised: 08/31/2015 Document Reviewed: 04/04/2013 Elsevier Interactive Patient Education  2018 [employer].  Bronchospasm, Adult Bronchospasm is a tightening of the airways going into the lungs. During an episode, it may be harder to breathe. You may cough, and you may make a whistling sound when you breathe (wheeze). This condition often affects people with asthma. What are the causes? This condition is caused by swelling and irritation in the airways. It can be triggered by:  An infection (common).  Seasonal allergies.  An allergic reaction.  Exercise.  Irritants. These include pollution, cigarette smoke, strong odors, aerosol sprays, and paint fumes.  Weather changes. Winds increase molds and pollens in the air. Cold air may cause swelling.  Stress and emotional upset.  What are the signs or symptoms? Symptoms of this condition include:  Wheezing. If the episode was triggered by an allergy, wheezing may start right away or hours later.  Nighttime coughing.  Frequent or severe coughing with a simple cold.  Chest  tightness.  Shortness of breath.  Decreased ability to exercise.  How is this diagnosed? This condition is usually diagnosed with a review of your medical history and a physical exam. Tests, such as lung function tests, are sometimes done to look for other conditions. The need for a chest X-ray depends on where the wheezing occurs and whether it is the first time you have wheezed. How is this treated? This condition may be treated with:  Inhaled medicines. These open up the airways and help you breathe. They can be taken with an inhaler or a nebulizer device.  Corticosteroid medicines. These may be given for severe bronchospasm, usually when it is associated with asthma.  Avoiding triggers, such as irritants, infection, or allergies.  Follow these instructions at home: Medicines  Take over-the-counter and prescription medicines only as told by your health care provider.  If you need to use an inhaler or nebulizer to take your medicine, ask your health care provider to explain how to use it correctly. If you were given a spacer, always use it with your inhaler. Lifestyle  Reduce the number of triggers in your home. To do this: ? Change your heating and air conditioning filter at least once a month. ? Limit your use of fireplaces and wood stoves. ? Do not smoke. Do not allow smoking in your home. ? Avoid using perfumes and fragrances. ? Get rid of pests, such as roaches and mice, and their droppings. ? Remove any mold from your home. ? Keep your house clean and dust free. Use unscented cleaning products. ? Replace carpet with wood, tile, or vinyl flooring. Carpet can trap dander and dust. ? Use allergy-proof pillows, mattress covers, and box spring covers. ? Wash bed sheets and blankets every week in hot water. Dry them in a dryer. ? Use blankets that are made of polyester or cotton. ? Wash your hands often. ? Do not allow pets in your bedroom.  Avoid breathing in cold air when  you exercise. General instructions  Have a plan for seeking medical care. Know when to call your health care provider and local emergency services, and where to get emergency care.  Stay up to date on your immunizations.  When you have an episode of bronchospasm, stay calm. Try to relax and breathe more slowly.  If you have asthma, make sure you have an asthma action plan.  Keep  all follow-up visits as told by your health care provider. This is important. Contact a health care provider if:  You have muscle aches.  You have chest pain.  The mucus that you cough up (sputum) changes from clear or white to yellow, green, gray, or bloody.  You have a fever.  Your sputum gets thicker. Get help right away if:  Your wheezing and coughing get worse, even after you take your prescribed medicines.  It gets even harder to breathe.  You develop severe chest pain. Summary  Bronchospasm is a tightening of the airways going into the lungs.  During an episode of bronchospasm, you may have a harder time breathing. You may cough and make a whistling sound when you breathe (wheeze).  Avoid exposure to triggers such as smoke, dust, mold, animal dander, and fragrances.  When you have an episode of bronchospasm, stay calm. Try to relax and breathe more slowly. This information is not intended to replace advice given to you by your health care provider. Make sure you discuss any questions you have with your health care provider. Document Released: 12/31/2002 Document Revised: 12/25/2015 Document Reviewed: 12/25/2015 Elsevier Interactive Patient Education  2017 [employer]  Sinusitis, Adult Sinusitis is soreness and inflammation of your sinuses. Sinuses are hollow spaces in the bones around your face. They are located:  Around your eyes.  In the middle of your forehead.  Behind your nose.  In your cheekbones.  Your sinuses and nasal passages are lined with a stringy fluid (mucus).  Mucus normally drains out of your sinuses. When your nasal tissues get inflamed or swollen, the mucus can get trapped or blocked so air cannot flow through your sinuses. This lets bacteria, viruses, and funguses grow, and that leads to infection. Follow these instructions at home: Medicines  Take, use, or apply over-the-counter and prescription medicines only as told by your doctor. These may include nasal sprays.  If you were prescribed an antibiotic medicine, take it as told by your doctor. Do not stop taking the antibiotic even if you start to feel better. Hydrate and Humidify  Drink enough water to keep your pee (urine) clear or pale yellow.  Use a cool mist humidifier to keep the humidity level in your home above 50%.  Breathe in steam for 10-15 minutes, 3-4 times a day or as told by your doctor. You can do this in the bathroom while a hot shower is running.  Try not to spend time in cool or dry air. Rest  Rest as much as possible.  Sleep with your head raised (elevated).  Make sure to get enough sleep each night. General instructions  Put a warm, moist washcloth on your face 3-4 times a day or as told by your doctor. This will help with discomfort.  Wash your hands often with soap and water. If there is no soap and water, use hand sanitizer.  Do not smoke. Avoid being around people who are smoking (secondhand smoke).  Keep all follow-up visits as told by your doctor. This is important. Contact a doctor if:  You have a fever.  Your symptoms get worse.  Your symptoms do not get better within 10 days. Get help right away if:  You have a very bad headache.  You cannot stop throwing up (vomiting).  You have pain or swelling around your face or eyes.  You have trouble seeing.  You feel confused.  Your neck is stiff.  You have trouble breathing. This information is  not intended to replace advice given to you by your health care provider. Make sure you discuss any  questions you have with your health care provider. Document Released: 06/16/2007 Document Revised: 08/24/2015 Document Reviewed: 10/23/2014 Elsevier Interactive Patient Education  2018 [place] with Quitting Smoking Quitting smoking is a physical and mental challenge. You will face cravings, withdrawal symptoms, and temptation. Before quitting, work with your health care provider to make a plan that can help you cope. Preparation can help you quit and keep you from giving in. How can I cope with cravings? Cravings usually last for 5-10 minutes. If you get through it, the craving will pass. Consider taking the following actions to help you cope with cravings:  Keep your mouth busy: ? Chew sugar-free gum. ? Suck on hard candies or a straw. ? Brush your teeth.  Keep your hands and body busy: ? Immediately change to a different activity when you feel a craving. ? Squeeze or play with a ball. ? Do an activity or a hobby, like making bead jewelry, practicing needlepoint, or working with wood. ? Mix up your normal routine. ? Take a short exercise break. Go for a quick walk or run up and down stairs. ? Spend time in public places where smoking is not allowed.  Focus on doing something kind or helpful for someone else.  Call a friend or family member to talk during a craving.  Join a support group.  Call a quit line, such as 1-800-QUIT-NOW.  Talk with your health care provider about medicines that might help you cope with cravings and make quitting easier for you.  How can I deal with withdrawal symptoms? Your body may experience negative effects as it tries to get used to not having nicotine in the system. These effects are called withdrawal symptoms. They may include:  Feeling hungrier than normal.  Trouble concentrating.  Irritability.  Trouble sleeping.  Feeling depressed.  Restlessness and agitation.  Craving a cigarette.  To manage withdrawal  symptoms:  Avoid places, people, and activities that trigger your cravings.  Remember why you want to quit.  Get plenty of sleep.  Avoid coffee and other caffeinated drinks. These may worsen some of your symptoms.  How can I handle social situations? Social situations can be difficult when you are quitting smoking, especially in the first few weeks. To manage this, you can:  Avoid parties, bars, and other social situations where people might be smoking.  Avoid alcohol.  Leave right away if you have the urge to smoke.  Explain to your family and friends that you are quitting smoking. Ask for understanding and support.  Plan activities with friends or family where smoking is not an option.  What are some ways I can cope with stress? Wanting to smoke may cause stress, and stress can make you want to smoke. Find ways to manage your stress. Relaxation techniques can help. For example:  Breathe slowly and deeply, in through your nose and out through your mouth.  Listen to soothing, relaxing music.  Talk with a family member or friend about your stress.  Light a candle.  Soak in a bath or take a shower.  Think about a peaceful place.  What are some ways I can prevent weight gain? Be aware that many people gain weight after they quit smoking. However, not everyone does. To keep from gaining weight, have a plan in place before you quit and stick to the plan after you quit.  Your plan should include:  Having healthy snacks. When you have a craving, it may help to: ? Eat plain popcorn, crunchy carrots, celery, or other cut vegetables. ? Chew sugar-free gum.  Changing how you eat: ? Eat small portion sizes at meals. ? Eat 4-6 small meals throughout the day instead of 1-2 large meals a day. ? Be mindful when you eat. Do not watch television or do other things that might distract you as you eat.  Exercising regularly: ? Make time to exercise each day. If you do not have time for a  long workout, do short bouts of exercise for 5-10 minutes several times a day. ? Do some form of strengthening exercise, like weight lifting, and some form of aerobic exercise, like running or swimming.  Drinking plenty of water or other low-calorie or no-calorie drinks. Drink 6-8 glasses of water daily, or as much as instructed by your health care provider.  Summary  Quitting smoking is a physical and mental challenge. You will face cravings, withdrawal symptoms, and temptation to smoke again. Preparation can help you as you go through these challenges.  You can cope with cravings by keeping your mouth busy (such as by chewing gum), keeping your body and hands busy, and making calls to family, friends, or a helpline for people who want to quit smoking.  You can cope with withdrawal symptoms by avoiding places where people smoke, avoiding drinks with caffeine, and getting plenty of rest.  Ask your health care provider about the different ways to prevent weight gain, avoid stress, and handle social situations. This information is not intended to replace advice given to you by your health care provider. Make sure you discuss any questions you have with your health care provider. Document Released: 12/26/2015 Document Revised: 12/26/2015 Document Reviewed: 12/26/2015 Elsevier Interactive Patient Education  2018 [employer].  Smoking Tobacco Information Smoking tobacco will very likely harm your health. Tobacco contains a poisonous (toxic), colorless chemical called nicotine. Nicotine affects the brain and makes tobacco addictive. This change in your brain can make it hard to stop smoking. Tobacco also has other toxic chemicals that can hurt your body and raise your risk of many cancers. How can smoking tobacco affect me? Smoking tobacco can increase your chances of having serious health conditions, such as:  Cancer. Smoking is most commonly associated with lung cancer, but can lead to cancer in  other parts of the body.  Chronic obstructive pulmonary disease (COPD). This is a long-term lung condition that makes it hard to breathe. It also gets worse over time.  High blood pressure (hypertension), heart disease, stroke, or heart attack.  Lung infections, such as pneumonia.  Cataracts. This is when the lenses in the eyes become clouded.  Digestive problems. This may include peptic ulcers, heartburn, and gastroesophageal reflux disease (GERD).  Oral health problems, such as gum disease and tooth loss.  Loss of taste and smell.  Smoking can affect your appearance by causing:  Wrinkles.  Yellow or stained teeth, fingers, and fingernails.  Smoking tobacco can also affect your social life.  Many workplaces, [employer], hotels, and public places are tobacco-free. This means that you may experience challenges in finding places to smoke when away from home.  The cost of a smoking habit can be expensive. Expenses for someone who smokes come in two ways: ? You spend money on a regular basis to buy tobacco. ? Your health care costs in the long-term are higher if you smoke.  Tobacco  smoke can also affect the health of those around you. Children of smokers have greater chances of: ? Sudden infant death syndrome (SIDS). ? Ear infections. ? Lung infections.  What lifestyle changes can be made?  Do not start smoking. Quit if you already do.  To quit smoking: ? Make a plan to quit smoking and commit yourself to it. Look for programs to help you and ask your health care provider for recommendations and ideas. ? Talk with your health care provider about using nicotine replacement medicines to help you quit. Medicine replacement medicines include gum, lozenges, patches, sprays, or pills. ? Do not replace cigarette smoking with electronic cigarettes, which are commonly called e-cigarettes. The safety of e-cigarettes is not known, and some may contain harmful chemicals. ? Avoid places,  people, or situations that tempt you to smoke. ? If you try to quit but return to smoking, don't give up hope. It is very common for people to try a number of times before they fully succeed. When you feel ready again, give it another try.  Quitting smoking might affect the way you eat as well as your weight. Be prepared to monitor your eating habits. Get support in planning and following a healthy diet.  Ask your health care provider about having regular tests (screenings) to check for cancer. This may include blood tests, imaging tests, and other tests.  Exercise regularly. Consider taking walks, joining a gym, or doing yoga or exercise classes.  Develop skills to manage your stress. These skills include meditation. What are the benefits of quitting smoking? By quitting smoking, you may:  Lower your risk of getting cancer and other diseases caused by smoking.  Live longer.  Breathe better.  Lower your blood pressure and heart rate.  Stop your addiction to tobacco.  Stop creating secondhand smoke that hurts other people.  Improve your sense of taste and smell.  Look better over time, due to having fewer wrinkles and less staining.  What can happen if changes are not made? If you do not stop smoking, you may:  Get cancer and other diseases.  Develop COPD or other long-term (chronic) lung conditions.  Develop serious problems with your heart and blood vessels (cardiovascular system).  Need more tests to screen for problems caused by smoking.  Have higher, long-term healthcare costs from medicines or treatments related to smoking.  Continue to have worsening changes in your lungs, mouth, and nose.  Where to find support: To get support to quit smoking, consider:  Asking your health care provider for more information and resources.  Taking classes to learn more about quitting smoking.  Looking for local organizations that offer resources about quitting  smoking.  Joining a support group for people who want to quit smoking in your local community.  Where to find more information: You may find more information about quitting smoking from:  HelpGuide.org: www.helpguide.org/articles/addictions/how-to-quit-smoking.htm  https://hall.com/: smokefree.gov  American Lung Association: www.lung.org  Contact a health care provider if:  You have problems breathing.  Your lips, nose, or fingers turn blue.  You have chest pain.  You are coughing up blood.  You feel faint or you pass out.  You have other noticeable changes that cause you to worry. Summary  Smoking tobacco can negatively affect your health, the health of those around you, your finances, and your social life.  Do not start smoking. Quit if you already do. If you need help quitting, ask your health care provider.  Think about joining a  support group for people who want to quit smoking in your local community. There are many effective programs that will help you to quit this behavior. This information is not intended to replace advice given to you by your health care provider. Make sure you discuss any questions you have with your health care provider. Document Released: 01/13/2016 Document Revised: 01/13/2016 Document Reviewed: 01/13/2016 Elsevier Interactive Patient Education  [employer].

## 2016-11-26 NOTE — Progress Notes (Addendum)
Subjective:     Patient ID: Benjamin Ball, male   DOB: 1966-01-03, 52 y.o.   MRN: [id number]  HPI Patient is a 51 year old male in no acute distress who returns to the office. He was seen on 09/08/16 for bronchitis and he was given Augmentin and reports he completed the dosage. He does report sinus congestion and pressure since last visit. He reports he might have felt better for " a couple of days" after the antibiotic.   He reports he has been exposed to a lot of mold at work. 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 He reports he now has respirator mask but reports  he needs his manager to  get more filters for this mask ordered. He reports mold in many buildings at [place].   He reports he  Has chronic pain and  is now having body aches all over for the past month. He does report having some chills x 2 days. Runny nose, congestive in the last week. Intermittent diarrhea up to three times a day last week. He reports last diarrhea was yesterday morning, feels fluid in his ears. He reports fatigue and being less active than he usually is.  He is still working everyday.  He reports his cough has worsened in the past week, he has productive cough clear cloudy sputum. He is a smoker 1 PPD. He has had wheezing and inhaler has controlled.   He denies any fevers at home.  He denies nausea and vomiting.  Blood pressure 108/72, pulse 96, temperature 98.4 F (36.9 C), resp. rate 16, weight 160 lb (72.6 kg), SpO2 93 %.  Filed Weights   11/26/16 0949  Weight: 160 lb (72.6 kg)   Weight 06/22/16  Was 160 lbs.  Wife came to the clinic and reports he has lost weight since last year as clothes are fitting larger and that he eats all of the time.  Weight  Was 176 lbs at 2017 appt with Lucille Passy, MD   Current Outpatient Medications:  .  acetaminophen (TYLENOL) 500 MG tablet, Take 1,000 mg by mouth 4 (four) times daily as needed., Disp: , Rfl:  .  albuterol (PROVENTIL HFA;VENTOLIN HFA) 108 (90 Base)  MCG/ACT inhaler, Inhale 2 puffs into the lungs every 6 (six) hours as needed for wheezing or shortness of breath., Disp: 1 Inhaler, Rfl: 2 .  dextromethorphan-guaiFENesin (ROBITUSSIN-DM) 10-100 MG/5ML liquid, Take every 4 (four) hours as needed by mouth for cough., Disp: , Rfl:  .  ibuprofen (ADVIL,MOTRIN) 200 MG tablet, Take 600-800 mg by mouth every 6 (six) hours as needed for moderate pain., Disp: , Rfl:  .  lidocaine (LIDODERM) 5 %, Place 1 patch onto the skin daily. Remove & Discard patch within 12 hours or as directed by MD (Patient not taking: Reported on 09/08/2016), Disp: 14 patch, Rfl: 1    Patient Active Problem List   Diagnosis Date Noted  . Spondylolisthesis, congenital 06/18/2016  . Spermatocele of epididymis, single 03/10/2016  . Epididymal cyst 02/18/2015  . Testicular pain 02/18/2015  . S/P lumbar spinal fusion 12/18/2013  . Scrotal mass 12/18/2013  . Tobacco abuse 12/18/2013  . Eczema, dyshidrotic 07/20/2012  . Vitamin D deficiency 07/20/2012  . Hyperlipidemia   . Backache      Lucille Passy, MD- he is due for physical he reports and wife schedule.  Last 2017 January. He reports his wife is calling to schedule.    Review of Systems  Constitutional: Positive for chills and fatigue. Negative for activity change, appetite change, diaphoresis, fever and unexpected weight change.  HENT: Positive for congestion, ear pain, postnasal drip, sinus pressure and sneezing. Negative for sinus pain (mild frontal ), sore throat, tinnitus, trouble swallowing and voice change.   Eyes: Negative.   Respiratory: Positive for cough, chest tightness, shortness of breath (with exertion for last two weeks ) and wheezing. Negative for apnea, choking and stridor.   Cardiovascular: Negative for chest pain, palpitations and leg swelling.  Gastrointestinal: Positive for diarrhea (none today ). Negative for anal bleeding and blood in stool.  Endocrine: Negative.   Genitourinary: Negative.    Musculoskeletal: Positive for myalgias (mild ).  Skin: Positive for rash (history of excema on hands ).  Allergic/Immunologic: Positive for environmental allergies.  Neurological: Positive for headaches (" sinus headache" frontal headache is area patinet points to. ).  Hematological: Negative.   Psychiatric/Behavioral: Negative.        Objective:   Physical Exam  Constitutional: He is oriented to person, place, and time. He appears well-developed and well-nourished. No distress.  Cardiovascular: Normal rate, regular rhythm, normal heart sounds and intact distal pulses. Exam reveals no gallop and no friction rub.  No murmur heard. Pulmonary/Chest: Effort normal. No respiratory distress. He has no decreased breath sounds. He has wheezes (moderate worse on inspiratrion  wheezing heard with ausculltation  reduced to minimal wheezing throughout after in office duoneb treatment. Oxygen saturation increased from  93 % to 95 % ) in the right upper field, the right middle field, the right lower field, the left upper field, the left middle field and the left lower field. He has no rales. He exhibits no tenderness.  Abdominal: Soft. Bowel sounds are normal. He exhibits no distension. There is no tenderness.  Musculoskeletal: Normal range of motion.  Neurological: He is alert and oriented to person, place, and time. He has normal reflexes.  Skin: Skin is warm and dry. No rash noted. He is not diaphoretic. No erythema. No pallor.  Psychiatric: He has a normal mood and affect. His behavior is normal. Judgment and thought content normal.  Vitals reviewed.      Assessment:       Encounter related to worker's compensation claim  Wheezing on auscultation - Plan: CBC w/Diff, ipratropium-albuterol (DUONEB) 0.5-2.5 (3) MG/3ML nebulizer solution 3 mL, Comp Met (CMET), DG Chest 2 View  Mold exposure  Tobacco use disorder, continuous  Tobacco abuse counseling  Smoking addiction - Plan: DG Chest 2  View  Cough    Plan:      Meds ordered this encounter  Medications  .   . ipratropium-albuterol (DUONEB) 0.5-2.5 (3) MG/3ML nebulizer solution 3 mL  . levofloxacin (LEVAQUIN) 500 MG tablet    Sig: Take 1 tablet (500 mg total) daily by mouth.    Dispense:  7 tablet    Refill:  0  . predniSONE (STERAPRED UNI-PAK 21 TAB) 10 MG (21) TBPK tablet    Sig: Take 6 tablets on day 1, take 5 tablets on day 2, and 4 tablets on day 3, and 3 tablets on day 2 and 2 tablets on day 1    Dispense:  21 tablet    Refill:  0   Discontinue cough suppressant may continue plain Mucinex.  Chest x ray now at [facility]   Chest x-ray and labs pending will call with results when avaliable.  Refill given for inhaler - Albuterol   Start Claritin 72m daily or equivalent.  Schedule an appointment with Lucille Passy, MD for full supervisor.   Have supervisor order filters for respirator and wear anytime you have any exposures at work.   Smoking cessation discussed, he is not ready to try medications. Offered counseling and patient will cal office and call [place] counselors as paper was given.   Note given to be excused from work today and will need recheck in office on Monday 11/19/ 18 .   Provider thoroughly discussed in collaboration above plan with supervising physician Dr. Miguel Aschoff who is in agreement with the care plan as above.

## 2016-11-27 LAB — COMPREHENSIVE METABOLIC PANEL
ALBUMIN: 4.6 g/dL (ref 3.5–5.5)
ALK PHOS: 87 IU/L (ref 39–117)
ALT: 18 IU/L (ref 0–44)
AST: 16 IU/L (ref 0–40)
Albumin/Globulin Ratio: 1.8 (ref 1.2–2.2)
BUN/Creatinine Ratio: 10 (ref 9–20)
BUN: 9 mg/dL (ref 6–24)
Bilirubin Total: 0.4 mg/dL (ref 0.0–1.2)
CO2: 24 mmol/L (ref 20–29)
CREATININE: 0.94 mg/dL (ref 0.76–1.27)
Calcium: 9.4 mg/dL (ref 8.7–10.2)
Chloride: 103 mmol/L (ref 96–106)
GFR calc Af Amer: 107 mL/min/{1.73_m2} (ref >59)
GFR, EST NON AFRICAN AMERICAN: 92 mL/min/{1.73_m2} (ref >59)
GLOBULIN, TOTAL: 2.5 g/dL (ref 1.5–4.5)
GLUCOSE: 81 mg/dL (ref 65–99)
Potassium: 4.4 mmol/L (ref 3.5–5.2)
SODIUM: 140 mmol/L (ref 134–144)
Total Protein: 7.1 g/dL (ref 6.0–8.5)

## 2016-11-27 LAB — CBC WITH DIFFERENTIAL/PLATELET
BASOS ABS: 0 10*3/uL (ref 0.0–0.2)
Basos: 0 %
EOS (ABSOLUTE): 0.2 10*3/uL (ref 0.0–0.4)
EOS: 1 %
HEMATOCRIT: 43 % (ref 37.5–51.0)
HEMOGLOBIN: 15.1 g/dL (ref 13.0–17.7)
IMMATURE GRANULOCYTES: 0 %
Immature Grans (Abs): 0 10*3/uL (ref 0.0–0.1)
LYMPHS: 13 %
Lymphocytes Absolute: 2.2 10*3/uL (ref 0.7–3.1)
MCH: 32.8 pg (ref 26.6–33.0)
MCHC: 35.1 g/dL (ref 31.5–35.7)
MCV: 94 fL (ref 79–97)
MONOCYTES: 9 %
Monocytes Absolute: 1.5 10*3/uL — ABNORMAL HIGH (ref 0.1–0.9)
NEUTROS PCT: 77 %
Neutrophils Absolute: 13.1 10*3/uL — ABNORMAL HIGH (ref 1.4–7.0)
Platelets: 250 10*3/uL (ref 150–379)
RBC: 4.6 x10E6/uL (ref 4.14–5.80)
RDW: 13.4 % (ref 12.3–15.4)
WBC: 17.1 10*3/uL — AB (ref 3.4–10.8)

## 2016-11-29 ENCOUNTER — Encounter: Payer: Self-pay | Admitting: Family Medicine

## 2016-11-29 ENCOUNTER — Ambulatory Visit: Payer: Self-pay | Admitting: Medical

## 2016-11-29 ENCOUNTER — Ambulatory Visit: Payer: BLUE CROSS/BLUE SHIELD | Admitting: Family Medicine

## 2016-11-29 ENCOUNTER — Encounter: Payer: Self-pay | Admitting: Medical

## 2016-11-29 ENCOUNTER — Ambulatory Visit: Payer: Self-pay | Admitting: *Deleted

## 2016-11-29 ENCOUNTER — Telehealth: Payer: Self-pay | Admitting: Medical

## 2016-11-29 VITALS — BP 118/76 | HR 81 | Temp 97.7°F | Wt 161.0 lb

## 2016-11-29 DIAGNOSIS — D72829 Elevated white blood cell count, unspecified: Secondary | ICD-10-CM

## 2016-11-29 DIAGNOSIS — R062 Wheezing: Secondary | ICD-10-CM

## 2016-11-29 DIAGNOSIS — Z77120 Contact with and (suspected) exposure to mold (toxic): Secondary | ICD-10-CM

## 2016-11-29 LAB — CBC WITH DIFFERENTIAL/PLATELET
BASOS ABS: 0 10*3/uL (ref 0.0–0.2)
Basos: 0 %
EOS (ABSOLUTE): 0 10*3/uL (ref 0.0–0.4)
Eos: 0 %
HEMATOCRIT: 42.4 % (ref 37.5–51.0)
HEMOGLOBIN: 14.1 g/dL (ref 13.0–17.7)
IMMATURE GRANS (ABS): 0.1 10*3/uL (ref 0.0–0.1)
Immature Granulocytes: 1 %
LYMPHS ABS: 0.8 10*3/uL (ref 0.7–3.1)
LYMPHS: 8 %
MCH: 31.7 pg (ref 26.6–33.0)
MCHC: 33.3 g/dL (ref 31.5–35.7)
MCV: 95 fL (ref 79–97)
MONOCYTES: 7 %
Monocytes Absolute: 0.8 10*3/uL (ref 0.1–0.9)
NEUTROS ABS: 8.6 10*3/uL — AB (ref 1.4–7.0)
Neutrophils: 84 %
Platelets: 284 10*3/uL (ref 150–379)
RBC: 4.45 x10E6/uL (ref 4.14–5.80)
RDW: 13.5 % (ref 12.3–15.4)
WBC: 10.3 10*3/uL (ref 3.4–10.8)

## 2016-11-29 MED ORDER — ALBUTEROL SULFATE (2.5 MG/3ML) 0.083% IN NEBU
2.5000 mg | INHALATION_SOLUTION | Freq: Once | RESPIRATORY_TRACT | Status: AC
  Administered 2016-11-29: 2.5 mg via RESPIRATORY_TRACT

## 2016-11-29 MED ORDER — IPRATROPIUM-ALBUTEROL 0.5-2.5 (3) MG/3ML IN SOLN
3.0000 mL | Freq: Once | RESPIRATORY_TRACT | Status: AC
  Administered 2016-11-29: 3 mL via RESPIRATORY_TRACT

## 2016-11-29 NOTE — Telephone Encounter (Signed)
  Reason for Disposition . Wheezing is present  Answer Assessment - Initial Assessment Questions 1. ONSET: "When did the cough begin?"     Thursday night worsened, has cough previously 2. SEVERITY: "How bad is the cough today?"      Bad today, has wheezing 3. RESPIRATORY DISTRESS: "Describe your breathing."      SOB at times, 4. FEVER: "Do you have a fever?" If so, ask: "What is your temperature, how was it measured, and when did it start?"     No 5. SPUTUM: "Describe the color of your sputum" (clear, white, yellow, green)     Milky white 6. HEMOPTYSIS: "Are you coughing up any blood?" If so ask: "How much?" (flecks, streaks, tablespoons, etc.)     No 7. CARDIAC HISTORY: "Do you have any history of heart disease?" (e.g., heart attack, congestive heart failure)      No 8. LUNG HISTORY: "Do you have any history of lung disease?"  (e.g., pulmonary embolus, asthma, emphysema)     Unsure if he has COPD, was told previously be MD 9. PE RISK FACTORS: "Do you have a history of blood clots?" (or: recent major surgery, recent prolonged travel, bedridden )     NO 10. OTHER SYMPTOMS: "Do you have any other symptoms?" (e.g., runny nose, wheezing, chest pain)       Wheezing, runny nose, chest tightness 11. 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  Pt also had a chest x-ray at [facility]. Pt states he has a productive cough, with milky white sputum and is still experiencing wheezing. Flow Coordinator contacted and appt scheduled today for 1430, to be seen at the [facility]

## 2016-11-29 NOTE — Progress Notes (Signed)
   Subjective:    Patient ID: Benjamin Ball, male    DOB: 1965-05-21, 51 y.o.   MRN: [id number]  HPI 51 yo male in non acute distress, still wheezing some but feels 75 % better, has cut down on smoking had one cigarettes this morning. Saturday better, no fever, maybe chills on Saturday. None today.  Body aches resolved over the weekend.   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 Last day he was removiing mold was last Thursday, not wearing respirator in need of cartridges.  "Overall everything else feels better".   Review of Systems  Constitutional: Positive for fatigue. Negative for chills and fever.  HENT: Positive for congestion, postnasal drip, rhinorrhea, sinus pressure and sneezing. Negative for ear pain, nosebleeds, sinus pain and sore throat.   Eyes: Negative for discharge and itching.  Respiratory: Positive for cough, chest tightness (mild) and wheezing. Negative for shortness of breath.   Cardiovascular: Negative for chest pain, palpitations and leg swelling.  Gastrointestinal: Negative for abdominal pain.  Endocrine: Negative for cold intolerance and heat intolerance.  Genitourinary: Negative for dysuria.  Musculoskeletal: Positive for back pain (chronic). Negative for myalgias.  Skin: Positive for rash (eczema on hands, he thinks it is may be caused from the mold).  Allergic/Immunologic: Positive for environmental allergies. Negative for food allergies and immunocompromised state.  Neurological: Negative for dizziness (on and off for years possibly in his ear he says.), syncope and light-headedness.  Hematological: Negative for adenopathy.  Psychiatric/Behavioral: Negative for behavioral problems, self-injury, sleep disturbance and suicidal ideas.   Wears gloves.    Objective:   Physical Exam  Constitutional: He is oriented to person, place, and time. He appears well-developed and  well-nourished.  HENT:  Head: Normocephalic and atraumatic.  Eyes: Conjunctivae and EOM are normal. Pupils are equal, round, and reactive to light.  Neck: Normal range of motion. Neck supple.  Cardiovascular: Normal rate, regular rhythm and normal heart sounds.  Pulmonary/Chest: Effort normal. No respiratory distress. He has wheezes. He has no rales.  Lymphadenopathy:    He has cervical adenopathy.  Neurological: He is alert and oriented to person, place, and time.  Skin: Skin is warm and dry.  Psychiatric: He has a normal mood and affect. His behavior is normal. Judgment and thought content normal.  Nursing note and vitals reviewed.    Cough noted in room he did not use his inhaler this morning, able to talk in full sentences.     Assessment & Plan:  Worker compensation claim, Bronchitis/ Wheezing /Smoker. Cough Rash on hands. Elevated WBC will repeat STAT Duoneb given in clinic.s/p treatment O2 sat 98%  HR 73 after 10 minutes O2 sat 93 hr 70 Still wheezing quit a bit. wilill give albuterol secondary treatment.after second neb treatment  O2 97% Hr 82 much improved on wheezing. Recheck on Wednesday 8am 12/01/16.Work note given. Will refer patient back to primary care doctor. Patient to call to day.  No more mold exposure without respiratior. Decrease cigarette smoking and try to quit.  He is to follow up with his primary doctor and discuss possible pulmonary and dermatology referrals. He is to follow up with this clinic on Wed 12/01/16 at  8 am  and seen to be cleared for work. Recheck on labs WBC 10.3  Called patient with results. Also reviewed date and time of next appointment.

## 2016-11-29 NOTE — Assessment & Plan Note (Signed)
Agree that his persistent symptoms are concerning. Mold exposure certainly could be a culprit here. Refer to pulmonary. The patient indicates understanding of these issues and agrees with the plan.

## 2016-11-29 NOTE — Progress Notes (Signed)
Subjective:   Patient ID: Benjamin Ball, male    DOB: Dec 14, 1965, 51 y.o.   MRN: [id number]  Benjamin Ball is a pleasant 51 y.o. year old male who presents to clinic today with Cough (Present for +3 months has been producing sputum. Was started on Prednisone on 11/26/16. He states his job  requires him to be around mold and spores at [employer]. )  on 11/29/2016  HPI:  Cough and wheezing- ongoing for greater than 3 months.   Was seen by occupational medicine with his employer on 09/08/16 and again on 11/26/16- workers comp case- note reviewed. On 09/08/16, was given Augmentin.  He reports that he has been around a lot of mold at work at [employer].  Neg CXR on 11/26/16'   Seen again at Occupational medicine earlier today- at that [place] states he feels 75% better.  WBC this morning was 10.3 and still wheezing on exam.  Advised to follow up with me here today.  Currently on prednisone. Current Outpatient Medications on File Prior to Visit  Medication Sig Dispense Refill  . acetaminophen (TYLENOL) 500 MG tablet Take 1,000 mg by mouth 4 (four) times daily as needed.    Marland Kitchen albuterol (PROVENTIL HFA;VENTOLIN HFA) 108 (90 Base) MCG/ACT inhaler Inhale 2 puffs every 6 (six) hours as needed into the lungs for wheezing or shortness of breath. 1 Inhaler 1  . dextromethorphan-guaiFENesin (ROBITUSSIN-DM) 10-100 MG/5ML liquid Take every 4 (four) hours as needed by mouth for cough.    Marland Kitchen ibuprofen (ADVIL,MOTRIN) 200 MG tablet Take 600-800 mg by mouth every 6 (six) hours as needed for moderate pain.    Marland Kitchen levofloxacin (LEVAQUIN) 500 MG tablet Take 1 tablet (500 mg total) daily by mouth. 7 tablet 0  . lidocaine (LIDODERM) 5 % Place 1 patch onto the skin daily. Remove & Discard patch within 12 hours or as directed by MD 14 patch 1  . predniSONE (STERAPRED UNI-PAK 21 TAB) 10 MG (21) TBPK tablet Take 6 tablets on day 1, take 5 tablets on day 2, and 4 tablets on day 3, and 3 tablets on day 2 and 2 tablets  on day 1 21 tablet 0   Current Facility-Administered Medications on File Prior to Visit  Medication Dose Route Frequency Provider Last Rate Last Dose  . ipratropium-albuterol (DUONEB) 0.5-2.5 (3) MG/3ML nebulizer solution 3 mL  3 mL Nebulization Once Flinchum, Kelby Aline, FNP        Allergies  Allergen Reactions  . Latex     WEARING GLOVES FOR AN EXTENDED PERIOD OF TIME    Past Medical History:  Diagnosis Date  . Arthritis   . Boil 04/09/2016   LEG-PT ENCOURAGED TO GET PCP TO LOOK AT LEG BEFORE UPCOMING SURGERY  . Chronic back pain    SCIATICA  . Cold   . GERD (gastroesophageal reflux disease)    occasional-TUMS PRN  . Heartburn   . Hyperlipidemia     Past Surgical History:  Procedure Laterality Date  . BACK SURGERY    . HAND SURGERY Right   . INGUINAL HERNIA REPAIR     age 35 or so  . KNEE SURGERY    . SPERMATOCELECTOMY Right 04/19/2016   Performed by Hollice Espy, MD at [facility] ORS  . WISDOM TOOTH EXTRACTION      Family History  Problem Relation Age of Onset  . Heart disease Father   . Colon cancer Neg Hx   . Rectal cancer Neg Hx   .  Stomach cancer Neg Hx     Social History   Socioeconomic History  . Marital status: Married    Spouse name: Not on file  . Number of children: Not on file  . Years of education: Not on file  . Highest education level: Not on file  Social Needs  . Financial resource strain: Not on file  . Food insecurity - worry: Not on file  . Food insecurity - inability: Not on file  . Transportation needs - medical: Not on file  . Transportation needs - non-medical: Not on file  Occupational History  . Not on file  Tobacco Use  . Smoking status: Current Every Day Smoker    Packs/day: 1.00    Years: 36.00    Pack years: 36.00    Types: Cigarettes  . Smokeless tobacco: Never Used  Substance and Sexual Activity  . Alcohol use: Yes    Alcohol/week: 4.2 oz    Types: 7 Standard drinks or equivalent per week    Comment: beer, wine,  liquor -2/3 BEERS/DAY  . Drug use: No  . Sexual activity: Not on file  Other Topics Concern  . Not on file  Social History Narrative   Married to Benjamin Ball, daughter is [name].      Works for [employer] in [employer].   The PMH, PSH, Social History, Family History, Medications, and allergies have been reviewed in [facility], and have been updated if relevant.   Review of Systems  Constitutional: Negative.   Respiratory: Positive for cough and wheezing.   Cardiovascular: Negative.   Gastrointestinal: Negative.   Endocrine: Negative.   Genitourinary: Negative.   Musculoskeletal: Negative.   Allergic/Immunologic: Negative.   Neurological: Negative.   Hematological: Negative.   Psychiatric/Behavioral: Negative.   All other systems reviewed and are negative.      Objective:    BP 132/76 (BP Location: Left Arm, Patient Position: Sitting, Cuff Size: Normal)   Pulse 63   Temp 98.3 F (36.8 C) (Oral)   Ht 5\' 11"  (1.803 m)   Wt 164 lb (74.4 kg)   SpO2 96%   BMI 22.87 kg/m    Physical Exam  Constitutional: He is oriented to person, place, and time. He appears well-developed and well-nourished. No distress.  HENT:  Head: Normocephalic and atraumatic.  Eyes: Conjunctivae are normal.  Cardiovascular: Normal rate.  Pulmonary/Chest: No respiratory distress. He has wheezes.  Musculoskeletal: Normal range of motion.  Neurological: He is alert and oriented to person, place, and time. No cranial nerve deficit.  Skin: Skin is warm and dry. Rash noted. He is not diaphoretic.  Psychiatric: He has a normal mood and affect. His behavior is normal. Judgment and thought content normal.  Nursing note and vitals reviewed.         Assessment & Plan:   Wheezing  Mold exposure No Follow-up on file.

## 2016-11-29 NOTE — Patient Instructions (Signed)
Return to clinic at [place] on 12/01/16 Continue current medications.   Acute Bronchitis, Adult Acute bronchitis is when air tubes (bronchi) in the lungs suddenly get swollen. The condition can make it hard to breathe. It can also cause these symptoms:  A cough.  Coughing up clear, yellow, or green mucus.  Wheezing.  Chest congestion.  Shortness of breath.  A fever.  Body aches.  Chills.  A sore throat.  Follow these instructions at home: Medicines  Take over-the-counter and prescription medicines only as told by your doctor.  If you were prescribed an antibiotic medicine, take it as told by your doctor. Do not stop taking the antibiotic even if you start to feel better. General instructions  Rest.  Drink enough fluids to keep your pee (urine) clear or pale yellow.  Avoid smoking and secondhand smoke. If you smoke and you need help quitting, ask your doctor. Quitting will help your lungs heal faster.  Use an inhaler, cool mist vaporizer, or humidifier as told by your doctor.  Keep all follow-up visits as told by your doctor. This is important. How is this prevented? To lower your risk of getting this condition again:  Wash your hands often with soap and water. If you cannot use soap and water, use hand sanitizer.  Avoid contact with people who have cold symptoms.  Try not to touch your hands to your mouth, nose, or eyes.  Make sure to get the flu shot every year.  Contact a doctor if:  Your symptoms do not get better in 2 weeks. Get help right away if:  You cough up blood.  You have chest pain.  You have very bad shortness of breath.  You become dehydrated.  You faint (pass out) or keep feeling like you are going to pass out.  You keep throwing up (vomiting).  You have a very bad headache.  Your fever or chills gets worse. This information is not intended to replace advice given to you by your health care provider. Make sure you discuss any  questions you have with your health care provider. Document Released: 06/16/2007 Document Revised: 08/06/2015 Document Reviewed: 06/18/2015 Elsevier Interactive Patient Education  2017 [employer] Bronchospasm, Adult Bronchospasm is a tightening of the airways going into the lungs. During an episode, it may be harder to breathe. You may cough, and you may make a whistling sound when you breathe (wheeze). This condition often affects people with asthma. What are the causes? This condition is caused by swelling and irritation in the airways. It can be triggered by:  An infection (common).  Seasonal allergies.  An allergic reaction.  Exercise.  Irritants. These include pollution, cigarette smoke, strong odors, aerosol sprays, and paint fumes.  Weather changes. Winds increase molds and pollens in the air. Cold air may cause swelling.  Stress and emotional upset.  What are the signs or symptoms? Symptoms of this condition include:  Wheezing. If the episode was triggered by an allergy, wheezing may start right away or hours later.  Nighttime coughing.  Frequent or severe coughing with a simple cold.  Chest tightness.  Shortness of breath.  Decreased ability to exercise.  How is this diagnosed? This condition is usually diagnosed with a review of your medical history and a physical exam. Tests, such as lung function tests, are sometimes done to look for other conditions. The need for a chest X-ray depends on where the wheezing occurs and whether it is the first time you have wheezed. How  is this treated? This condition may be treated with:  Inhaled medicines. These open up the airways and help you breathe. They can be taken with an inhaler or a nebulizer device.  Corticosteroid medicines. These may be given for severe bronchospasm, usually when it is associated with asthma.  Avoiding triggers, such as irritants, infection, or allergies.  Follow these instructions at  home: Medicines  Take over-the-counter and prescription medicines only as told by your health care provider.  If you need to use an inhaler or nebulizer to take your medicine, ask your health care provider to explain how to use it correctly. If you were given a spacer, always use it with your inhaler. Lifestyle  Reduce the number of triggers in your home. To do this: ? Change your heating and air conditioning filter at least once a month. ? Limit your use of fireplaces and wood stoves. ? Do not smoke. Do not allow smoking in your home. ? Avoid using perfumes and fragrances. ? Get rid of pests, such as roaches and mice, and their droppings. ? Remove any mold from your home. ? Keep your house clean and dust free. Use unscented cleaning products. ? Replace carpet with wood, tile, or vinyl flooring. Carpet can trap dander and dust. ? Use allergy-proof pillows, mattress covers, and box spring covers. ? Wash bed sheets and blankets every week in hot water. Dry them in a dryer. ? Use blankets that are made of polyester or cotton. ? Wash your hands often. ? Do not allow pets in your bedroom.  Avoid breathing in cold air when you exercise. General instructions  Have a plan for seeking medical care. Know when to call your health care provider and local emergency services, and where to get emergency care.  Stay up to date on your immunizations.  When you have an episode of bronchospasm, stay calm. Try to relax and breathe more slowly.  If you have asthma, make sure you have an asthma action plan.  Keep all follow-up visits as told by your health care provider. This is important. Contact a health care provider if:  You have muscle aches.  You have chest pain.  The mucus that you cough up (sputum) changes from clear or white to yellow, green, gray, or bloody.  You have a fever.  Your sputum gets thicker. Get help right away if:  Your wheezing and coughing get worse, even after you  take your prescribed medicines.  It gets even harder to breathe.  You develop severe chest pain. Summary  Bronchospasm is a tightening of the airways going into the lungs.  During an episode of bronchospasm, you may have a harder time breathing. You may cough and make a whistling sound when you breathe (wheeze).  Avoid exposure to triggers such as smoke, dust, mold, animal dander, and fragrances.  When you have an episode of bronchospasm, stay calm. Try to relax and breathe more slowly. This information is not intended to replace advice given to you by your health care provider. Make sure you discuss any questions you have with your health care provider. Document Released: 12/31/2002 Document Revised: 12/25/2015 Document Reviewed: 12/25/2015 Elsevier Interactive Patient Education  2017 [employer].

## 2016-11-29 NOTE — Progress Notes (Deleted)
SUBJECTIVE:  Benjamin Ball is a 51 y.o. male who complains of {uri sx:315001} for *** days. He denies a history of {hx resp sx additional:315009} and {has/denies:315300} a history of asthma. Patient {has/denies:315300} smoke cigarettes.   Current Outpatient Medications on File Prior to Visit  Medication Sig Dispense Refill  . acetaminophen (TYLENOL) 500 MG tablet Take 1,000 mg by mouth 4 (four) times daily as needed.    Marland Kitchen albuterol (PROVENTIL HFA;VENTOLIN HFA) 108 (90 Base) MCG/ACT inhaler Inhale 2 puffs every 6 (six) hours as needed into the lungs for wheezing or shortness of breath. 1 Inhaler 1  . dextromethorphan-guaiFENesin (ROBITUSSIN-DM) 10-100 MG/5ML liquid Take every 4 (four) hours as needed by mouth for cough.    Marland Kitchen ibuprofen (ADVIL,MOTRIN) 200 MG tablet Take 600-800 mg by mouth every 6 (six) hours as needed for moderate pain.    Marland Kitchen levofloxacin (LEVAQUIN) 500 MG tablet Take 1 tablet (500 mg total) daily by mouth. 7 tablet 0  . lidocaine (LIDODERM) 5 % Place 1 patch onto the skin daily. Remove & Discard patch within 12 hours or as directed by MD (Patient not taking: Reported on 09/08/2016) 14 patch 1  . predniSONE (STERAPRED UNI-PAK 21 TAB) 10 MG (21) TBPK tablet Take 6 tablets on day 1, take 5 tablets on day 2, and 4 tablets on day 3, and 3 tablets on day 2 and 2 tablets on day 1 21 tablet 0   Current Facility-Administered Medications on File Prior to Visit  Medication Dose Route Frequency Provider Last Rate Last Dose  . ipratropium-albuterol (DUONEB) 0.5-2.5 (3) MG/3ML nebulizer solution 3 mL  3 mL Nebulization Once Flinchum, Kelby Aline, FNP        Allergies  Allergen Reactions  . Latex     WEARING GLOVES FOR AN EXTENDED PERIOD OF TIME    Past Medical History:  Diagnosis Date  . Arthritis   . Boil 04/09/2016   LEG-PT ENCOURAGED TO GET PCP TO LOOK AT LEG BEFORE UPCOMING SURGERY  . Chronic back pain    SCIATICA  . Cold   . GERD (gastroesophageal reflux disease)    occasional-TUMS PRN  . Heartburn   . Hyperlipidemia     Past Surgical History:  Procedure Laterality Date  . BACK SURGERY    . HAND SURGERY Right   . INGUINAL HERNIA REPAIR     age 17 or so  . KNEE SURGERY    . SPERMATOCELECTOMY Right 04/19/2016   Performed by Hollice Espy, MD at [facility]  . WISDOM TOOTH EXTRACTION      Family History  Problem Relation Age of Onset  . Heart disease Father   . Colon cancer Neg Hx   . Rectal cancer Neg Hx   . Stomach cancer Neg Hx     Social History   Socioeconomic History  . Marital status: Married    Spouse name: Not on file  . Number of children: Not on file  . Years of education: Not on file  . Highest education level: Not on file  Social Needs  . Financial resource strain: Not on file  . Food insecurity - worry: Not on file  . Food insecurity - inability: Not on file  . Transportation needs - medical: Not on file  . Transportation needs - non-medical: Not on file  Occupational History  . Not on file  Tobacco Use  . Smoking status: Current Every Day Smoker    Packs/day: 1.00    Years: 36.00  Pack years: 36.00    Types: Cigarettes  . Smokeless tobacco: Never Used  Substance and Sexual Activity  . Alcohol use: Yes    Alcohol/week: 4.2 oz    Types: 7 Standard drinks or equivalent per week    Comment: beer, wine, liquor -2/3 BEERS/DAY  . Drug use: No  . Sexual activity: Not on file  Other Topics Concern  . Not on file  Social History Narrative   Married to [name], daughter is [name].      Works for [employer] in [employer].   The PMH, PSH, Social History, Family History, Medications, and allergies have been reviewed in [facility], and have been updated if relevant.  OBJECTIVE: There were no vitals taken for this visit.  He appears well, vital signs are as noted. Ears normal.  Throat and pharynx normal.  Neck supple. No adenopathy in the neck. Nose is congested. Sinuses non tender. The chest is clear,  without wheezes or rales.  ASSESSMENT:  {uri dx:315273::"viral upper respiratory illness"}  PLAN: Symptomatic therapy suggested: {resp KCMK:349179::"XTAV fluids","rest","return office visit prn if symptoms persist or worsen"}. Lack of antibiotic effectiveness discussed with him. Call or return to clinic prn if these symptoms worsen or fail to improve as anticipated.

## 2016-11-29 NOTE — Patient Instructions (Signed)
Great to see you. Please stop by to see Linda on your way out. 

## 2016-11-30 NOTE — Telephone Encounter (Signed)
Opened by mistake , patient seen in clinic see clinic note.

## 2016-12-01 ENCOUNTER — Ambulatory Visit: Payer: Self-pay | Admitting: Medical

## 2016-12-01 ENCOUNTER — Encounter: Payer: Self-pay | Admitting: Medical

## 2016-12-01 VITALS — BP 124/72 | HR 80 | Temp 97.7°F | Wt 164.4 lb

## 2016-12-01 DIAGNOSIS — R062 Wheezing: Secondary | ICD-10-CM

## 2016-12-01 MED ORDER — FLUTICASONE-SALMETEROL 250-50 MCG/DOSE IN AEPB: 1.0000 | INHALATION_SPRAY | Freq: Two times a day (BID) | RESPIRATORY_TRACT | 0 refills | Status: DC

## 2016-12-01 MED ORDER — PREDNISONE 10 MG (21) PO TBPK: ORAL_TABLET | ORAL | 0 refills | Status: DC

## 2016-12-01 MED ORDER — IPRATROPIUM-ALBUTEROL 0.5-2.5 (3) MG/3ML IN SOLN
3.0000 mL | Freq: Once | RESPIRATORY_TRACT | Status: AC
  Administered 2016-12-01: 3 mL via RESPIRATORY_TRACT

## 2016-12-01 NOTE — Progress Notes (Signed)
   Subjective:    Patient ID: Benjamin Ball, male    DOB: [birth year], 51 y.o.   MRN: [id number]  HPI  51 yo male in non acute distress ,  feeling  tight this am in chest  ,   Has appointment with Pulmonary on Dec  6th  3:15pm .  Seen by Primary care provider and  Set up for pulmonary  appointment.  Filters for his respirator mask are being ordered next week.  Cough productive white. Used inhaler about 7:30 am. Feels it helped some.  Says he smoked  1/2 ppd possibly yesterday.   Review of Systems  Constitutional: Positive for chills. Negative for fever.  HENT: Negative for ear discharge and sore throat.   Eyes: Negative for discharge and itching.  Respiratory: Positive for cough, chest tightness and shortness of breath.   Cardiovascular: Negative for chest pain, palpitations and leg swelling.  Gastrointestinal: Negative for abdominal pain.  Endocrine: Negative for polydipsia, polyphagia and polyuria.  Genitourinary: Negative for dysuria.  Musculoskeletal: Negative for myalgias.  Skin: Positive for rash (rash on hands , peeling skin improved has been on prednisone).  Allergic/Immunologic: Positive for environmental allergies. Negative for food allergies and immunocompromised state.  Neurological: Positive for dizziness and light-headedness. Negative for syncope.  Hematological: Negative for adenopathy.  Psychiatric/Behavioral: Negative for behavioral problems, self-injury and suicidal ideas. The patient is not nervous/anxious.        Objective:   Physical Exam  Constitutional: He is oriented to person, place, and time. He appears well-developed and well-nourished.  HENT:  Head: Normocephalic and atraumatic.  Eyes: Conjunctivae and EOM are normal. Pupils are equal, round, and reactive to light.  Cardiovascular: Normal rate, regular rhythm and normal heart sounds.  Pulmonary/Chest: No respiratory distress. He has wheezes. He has no rales.  Neurological: He is alert and oriented to  person, place, and time.  Skin: Skin is warm and dry.  Psychiatric: He has a normal mood and affect. His behavior is normal. Judgment and thought content normal.      s/p Duoneb  HR 82  O2sat 97% mild inspiratory wheeze.left lower lung.    Assessment & Plan:  Bronchitis resolving, still wheezing Will take out of work again today. Will do a  Second coarse of prednisone taper. Advised no smoking. Meds ordered this encounter  Medications  . ipratropium-albuterol (DUONEB) 0.5-2.5 (3) MG/3ML nebulizer solution 3 mL  . predniSONE (STERAPRED UNI-PAK 21 TAB) 10 MG (21) TBPK tablet    Sig: Take  6 tablets by mouth today then 5 tablets tomorrow then one tablet  less each day thereafter.take with food    Dispense:  21 tablet    Refill:  0  . Fluticasone-Salmeterol (ADVAIR DISKUS) 250-50 MCG/DOSE AEPB    Sig: Inhale 1 puff into the lungs 2 (two) times daily.    Dispense:  1 each    Refill:  0   Shortness of breath, trouble breathing, wheezing more, worsening chest tightness to seek out care at an Urgent Care or the Emergency Department. Follow up on Monday on  12/06/16 at  10 am for recheck . To be seen before cleared to return to work. Patient verbalizes  Understanding and has no questions at discharge. Also touched base on  COPD and smokers cough.

## 2016-12-01 NOTE — Patient Instructions (Signed)
Bronchitis  Bronchospasm, Adult Bronchospasm is a tightening of the airways going into the lungs. During an episode, it may be harder to breathe. You may cough, and you may make a whistling sound when you breathe (wheeze). This condition often affects people with asthma. What are the causes? This condition is caused by swelling and irritation in the airways. It can be triggered by:  An infection (common).  Seasonal allergies.  An allergic reaction.  Exercise.  Irritants. These include pollution, cigarette smoke, strong odors, aerosol sprays, and paint fumes.  Weather changes. Winds increase molds and pollens in the air. Cold air may cause swelling.  Stress and emotional upset.  What are the signs or symptoms? Symptoms of this condition include:  Wheezing. If the episode was triggered by an allergy, wheezing may start right away or hours later.  Nighttime coughing.  Frequent or severe coughing with a simple cold.  Chest tightness.  Shortness of breath.  Decreased ability to exercise.  How is this diagnosed? This condition is usually diagnosed with a review of your medical history and a physical exam. Tests, such as lung function tests, are sometimes done to look for other conditions. The need for a chest X-ray depends on where the wheezing occurs and whether it is the first time you have wheezed. How is this treated? This condition may be treated with:  Inhaled medicines. These open up the airways and help you breathe. They can be taken with an inhaler or a nebulizer device.  Corticosteroid medicines. These may be given for severe bronchospasm, usually when it is associated with asthma.  Avoiding triggers, such as irritants, infection, or allergies.  Follow these instructions at home: Medicines  Take over-the-counter and prescription medicines only as told by your health care provider.  If you need to use an inhaler or nebulizer to take your medicine, ask your  health care provider to explain how to use it correctly. If you were given a spacer, always use it with your inhaler. Lifestyle  Reduce the number of triggers in your home. To do this: ? Change your heating and air conditioning filter at least once a month. ? Limit your use of fireplaces and wood stoves. ? Do not smoke. Do not allow smoking in your home. ? Avoid using perfumes and fragrances. ? Get rid of pests, such as roaches and mice, and their droppings. ? Remove any mold from your home. ? Keep your house clean and dust free. Use unscented cleaning products. ? Replace carpet with wood, tile, or vinyl flooring. Carpet can trap dander and dust. ? Use allergy-proof pillows, mattress covers, and box spring covers. ? Wash bed sheets and blankets every week in hot water. Dry them in a dryer. ? Use blankets that are made of polyester or cotton. ? Wash your hands often. ? Do not allow pets in your bedroom.  Avoid breathing in cold air when you exercise. General instructions  Have a plan for seeking medical care. Know when to call your health care provider and local emergency services, and where to get emergency care.  Stay up to date on your immunizations.  When you have an episode of bronchospasm, stay calm. Try to relax and breathe more slowly.  If you have asthma, make sure you have an asthma action plan.  Keep all follow-up visits as told by your health care provider. This is important. Contact a health care provider if:  You have muscle aches.  You have chest pain.  The mucus  that you cough up (sputum) changes from clear or white to yellow, green, gray, or bloody.  You have a fever.  Your sputum gets thicker. Get help right away if:  Your wheezing and coughing get worse, even after you take your prescribed medicines.  It gets even harder to breathe.  You develop severe chest pain. Summary  Bronchospasm is a tightening of the airways going into the lungs.  During  an episode of bronchospasm, you may have a harder time breathing. You may cough and make a whistling sound when you breathe (wheeze).  Avoid exposure to triggers such as smoke, dust, mold, animal dander, and fragrances.  When you have an episode of bronchospasm, stay calm. Try to relax and breathe more slowly. This information is not intended to replace advice given to you by your health care provider. Make sure you discuss any questions you have with your health care provider. Document Released: 12/31/2002 Document Revised: 12/25/2015 Document Reviewed: 12/25/2015 Elsevier Interactive Patient Education  2017 [employer]  Acute Bronchitis, Adult Acute bronchitis is when air tubes (bronchi) in the lungs suddenly get swollen. The condition can make it hard to breathe. It can also cause these symptoms:  A cough.  Coughing up clear, yellow, or green mucus.  Wheezing.  Chest congestion.  Shortness of breath.  A fever.  Body aches.  Chills.  A sore throat.  Follow these instructions at home: Medicines  Take over-the-counter and prescription medicines only as told by your doctor.  If you were prescribed an antibiotic medicine, take it as told by your doctor. Do not stop taking the antibiotic even if you start to feel better. General instructions  Rest.  Drink enough fluids to keep your pee (urine) clear or pale yellow.  Avoid smoking and secondhand smoke. If you smoke and you need help quitting, ask your doctor. Quitting will help your lungs heal faster.  Use an inhaler, cool mist vaporizer, or humidifier as told by your doctor.  Keep all follow-up visits as told by your doctor. This is important. How is this prevented? To lower your risk of getting this condition again:  Wash your hands often with soap and water. If you cannot use soap and water, use hand sanitizer.  Avoid contact with people who have cold symptoms.  Try not to touch your hands to your mouth, nose,  or eyes.  Make sure to get the flu shot every year.  Contact a doctor if:  Your symptoms do not get better in 2 weeks. Get help right away if:  You cough up blood.  You have chest pain.  You have very bad shortness of breath.  You become dehydrated.  You faint (pass out) or keep feeling like you are going to pass out.  You keep throwing up (vomiting).  You have a very bad headache.  Your fever or chills gets worse. This information is not intended to replace advice given to you by your health care provider. Make sure you discuss any questions you have with your health care provider. Document Released: 06/16/2007 Document Revised: 08/06/2015 Document Reviewed: 06/18/2015 Elsevier Interactive Patient Education  2017 [employer].

## 2016-12-06 ENCOUNTER — Encounter: Payer: Self-pay | Admitting: Medical

## 2016-12-06 ENCOUNTER — Ambulatory Visit: Payer: Self-pay | Admitting: Medical

## 2016-12-06 VITALS — BP 110/70 | HR 78 | Temp 98.0°F | Wt 166.6 lb

## 2016-12-06 DIAGNOSIS — R059 Cough, unspecified: Secondary | ICD-10-CM

## 2016-12-06 DIAGNOSIS — F17200 Nicotine dependence, unspecified, uncomplicated: Secondary | ICD-10-CM

## 2016-12-06 DIAGNOSIS — R05 Cough: Secondary | ICD-10-CM

## 2016-12-06 NOTE — Patient Instructions (Signed)
Cough, Adult A cough helps to clear your throat and lungs. A cough may last only 2-3 weeks (acute), or it may last longer than 8 weeks (chronic). Many different things can cause a cough. A cough may be a sign of an illness or another medical condition. Follow these instructions at home:  Pay attention to any changes in your cough.  Take medicines only as told by your doctor. ? If you were prescribed an antibiotic medicine, take it as told by your doctor. Do not stop taking it even if you start to feel better. ? Talk with your doctor before you try using a cough medicine.  Drink enough fluid to keep your pee (urine) clear or pale yellow.  If the air is dry, use a cold steam vaporizer or humidifier in your home.  Stay away from things that make you cough at work or at home.  If your cough is worse at night, try using extra pillows to raise your head up higher while you sleep.  Do not smoke, and try not to be around smoke. If you need help quitting, ask your doctor.  Do not have caffeine.  Do not drink alcohol.  Rest as needed. Contact a doctor if:  You have new problems (symptoms).  You cough up yellow fluid (pus).  Your cough does not get better after 2-3 weeks, or your cough gets worse.  Medicine does not help your cough and you are not sleeping well.  You have pain that gets worse or pain that is not helped with medicine.  You have a fever.  You are losing weight and you do not know why.  You have night sweats. Get help right away if:  You cough up blood.  You have trouble breathing.  Your heartbeat is very fast. This information is not intended to replace advice given to you by your health care provider. Make sure you discuss any questions you have with your health care provider. Document Released: 09/10/2010 Document Revised: 06/05/2015 Document Reviewed: 03/06/2014 Elsevier Interactive Patient Education  2018 [employer]. Smoking Tobacco Information Smoking  tobacco will very likely harm your health. Tobacco contains a poisonous (toxic), colorless chemical called nicotine. Nicotine affects the brain and makes tobacco addictive. This change in your brain can make it hard to stop smoking. Tobacco also has other toxic chemicals that can hurt your body and raise your risk of many cancers. How can smoking tobacco affect me? Smoking tobacco can increase your chances of having serious health conditions, such as:  Cancer. Smoking is most commonly associated with lung cancer, but can lead to cancer in other parts of the body.  Chronic obstructive pulmonary disease (COPD). This is a long-term lung condition that makes it hard to breathe. It also gets worse over time.  High blood pressure (hypertension), heart disease, stroke, or heart attack.  Lung infections, such as pneumonia.  Cataracts. This is when the lenses in the eyes become clouded.  Digestive problems. This may include peptic ulcers, heartburn, and gastroesophageal reflux disease (GERD).  Oral health problems, such as gum disease and tooth loss.  Loss of taste and smell.  Smoking can affect your appearance by causing:  Wrinkles.  Yellow or stained teeth, fingers, and fingernails.  Smoking tobacco can also affect your social life.  Many workplaces, [employer], hotels, and public places are tobacco-free. This means that you may experience challenges in finding places to smoke when away from home.  The cost of a smoking habit can be  expensive. Expenses for someone who smokes come in two ways: ? You spend money on a regular basis to buy tobacco. ? Your health care costs in the long-term are higher if you smoke.  Tobacco smoke can also affect the health of those around you. Children of smokers have greater chances of: ? Sudden infant death syndrome (SIDS). ? Ear infections. ? Lung infections.  What lifestyle changes can be made?  Do not start smoking. Quit if you already do.  To  quit smoking: ? Make a plan to quit smoking and commit yourself to it. Look for programs to help you and ask your health care provider for recommendations and ideas. ? Talk with your health care provider about using nicotine replacement medicines to help you quit. Medicine replacement medicines include gum, lozenges, patches, sprays, or pills. ? Do not replace cigarette smoking with electronic cigarettes, which are commonly called e-cigarettes. The safety of e-cigarettes is not known, and some may contain harmful chemicals. ? Avoid places, people, or situations that tempt you to smoke. ? If you try to quit but return to smoking, don't give up hope. It is very common for people to try a number of times before they fully succeed. When you feel ready again, give it another try.  Quitting smoking might affect the way you eat as well as your weight. Be prepared to monitor your eating habits. Get support in planning and following a healthy diet.  Ask your health care provider about having regular tests (screenings) to check for cancer. This may include blood tests, imaging tests, and other tests.  Exercise regularly. Consider taking walks, joining a gym, or doing yoga or exercise classes.  Develop skills to manage your stress. These skills include meditation. What are the benefits of quitting smoking? By quitting smoking, you may:  Lower your risk of getting cancer and other diseases caused by smoking.  Live longer.  Breathe better.  Lower your blood pressure and heart rate.  Stop your addiction to tobacco.  Stop creating secondhand smoke that hurts other people.  Improve your sense of taste and smell.  Look better over time, due to having fewer wrinkles and less staining.  What can happen if changes are not made? If you do not stop smoking, you may:  Get cancer and other diseases.  Develop COPD or other long-term (chronic) lung conditions.  Develop serious problems with your heart  and blood vessels (cardiovascular system).  Need more tests to screen for problems caused by smoking.  Have higher, long-term healthcare costs from medicines or treatments related to smoking.  Continue to have worsening changes in your lungs, mouth, and nose.  Where to find support: To get support to quit smoking, consider:  Asking your health care provider for more information and resources.  Taking classes to learn more about quitting smoking.  Looking for local organizations that offer resources about quitting smoking.  Joining a support group for people who want to quit smoking in your local community.  Where to find more information: You may find more information about quitting smoking from:  HelpGuide.org: www.helpguide.org/articles/addictions/how-to-quit-smoking.htm  https://hall.com/: smokefree.gov  American Lung Association: www.lung.org  Contact a health care provider if:  You have problems breathing.  Your lips, nose, or fingers turn blue.  You have chest pain.  You are coughing up blood.  You feel faint or you pass out.  You have other noticeable changes that cause you to worry. Summary  Smoking tobacco can negatively affect your health, the  health of those around you, your finances, and your social life.  Do not start smoking. Quit if you already do. If you need help quitting, ask your health care provider.  Think about joining a support group for people who want to quit smoking in your local community. There are many effective programs that will help you to quit this behavior. This information is not intended to replace advice given to you by your health care provider. Make sure you discuss any questions you have with your health care provider. Document Released: 01/13/2016 Document Revised: 01/13/2016 Document Reviewed: 01/13/2016 Elsevier Interactive Patient Education  [employer].

## 2016-12-06 NOTE — Progress Notes (Addendum)
Work note completed and scanned to chart.  Subjective:    Patient ID: Benjamin Ball, male    DOB: 25-May-1965, 51 y.o.   MRN: [id number]  HPI 51 yo male in non acute distress comes in for follow up on Bronchitis and wheezing. Down to  4 cigarettes per day and not taking deep breaths, and only takes a couple of puffs and then puts the cigarette out.  Feeling better now. No wheezing mild cough. Appetite better too. Denies fever or chills or shortness of breath.   Review of Systems  Constitutional: Negative for chills, fatigue and fever.  HENT: Negative for ear pain and sore throat.   Eyes: Negative for discharge and itching.  Respiratory: Positive for cough. Negative for chest tightness, shortness of breath and wheezing.   Cardiovascular: Negative for chest pain, palpitations and leg swelling.  Gastrointestinal: Negative for abdominal pain.  Endocrine: Negative for cold intolerance and heat intolerance.  Genitourinary: Negative for dysuria.  Musculoskeletal: Negative for myalgias.  Skin: Negative for rash.  Allergic/Immunologic: Positive for environmental allergies.  Neurological: Positive for dizziness (same as last visti, patient notices when he gets rushed. and moving quickly. and getting up from sitting to quickly.). Negative for syncope and light-headedness.  Hematological: Positive for adenopathy. Bruises/bleeds easily: resolved.  Psychiatric/Behavioral: Negative for behavioral problems, self-injury and suicidal ideas.       Objective:   Physical Exam  Constitutional: He is oriented to person, place, and time. He appears well-developed and well-nourished.  HENT:  Head: Normocephalic and atraumatic.  Eyes: Conjunctivae and EOM are normal. Pupils are equal, round, and reactive to light.  Neck: Normal range of motion. Neck supple.  Cardiovascular: Normal rate, regular rhythm and normal heart sounds.  Pulmonary/Chest: Effort normal and breath sounds normal. No respiratory  distress. He has no wheezes. He has no rales.  Lymphadenopathy:    He has no cervical adenopathy.  Neurological: He is alert and oriented to person, place, and time.  Skin: Skin is warm and dry.  Psychiatric: He has a normal mood and affect. His behavior is normal. Judgment and thought content normal.  Nursing note and vitals reviewed.    Mild cough noted in room.     Assessment & Plan:  Bronchitis resolved.  Sees Pulmonary again on Dec ^th at  2:30pm.No mold exposure till cleared by Pulmonary and cleared to wear respirator. . Patient to continue to try to quit smoking.To continue to use Advair as directed and Albuterol ( as needed).  Return to clinic as needed. Patient verbalizes understanding and has no questions at discharge.

## 2016-12-16 ENCOUNTER — Ambulatory Visit: Payer: BLUE CROSS/BLUE SHIELD | Admitting: Internal Medicine

## 2016-12-16 ENCOUNTER — Institutional Professional Consult (permissible substitution): Payer: Self-pay | Admitting: Pulmonary Disease

## 2016-12-16 ENCOUNTER — Encounter: Payer: Self-pay | Admitting: Internal Medicine

## 2016-12-16 VITALS — BP 138/84 | HR 84 | Ht 71.0 in | Wt 166.0 lb

## 2016-12-16 DIAGNOSIS — R062 Wheezing: Secondary | ICD-10-CM

## 2016-12-16 DIAGNOSIS — Z77120 Contact with and (suspected) exposure to mold (toxic): Secondary | ICD-10-CM | POA: Diagnosis not present

## 2016-12-16 DIAGNOSIS — J441 Chronic obstructive pulmonary disease with (acute) exacerbation: Secondary | ICD-10-CM

## 2016-12-16 NOTE — Progress Notes (Signed)
[facility] Consultation      Assessment and Plan:  The patient had an acute episode of acute bronchitis, he is worried that this may represent an acute mold exposure.  We discussed that this may be a mold exposure, however he also has possible chronic bronchitis and/or COPD, and this may have been a simple exacerbation versus an episode of acute bronchitis. COPD with possible COPD exacerbation.  --We discussed performing a pulmonary function test to confirm the presence of COPD, he would like to defer for the time being. --Continue advair and ventolin, I asked that he follow-up in 3 months, but determine if he can be de-escalated on his medications, however he would like to defer this, and follow-up with his primary care physician.  --We discussed blood testing for possible mold exposures.  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  Explained that this may be an expensive test, he noted that he gets the blood testing at [facility] therefore he can get the test performed up there.  I have therefore ordered the test and asked that he follow-up with his primary care physician, he is welcome to follow-up here as well.   Date: 12/16/2016  MRN# [id number] Benjamin Ball June 20, 1965  Referring Physician:   [name] is a 51 y.o. old male seen in consultation for chief complaint of:    Chief Complaint  Patient presents with  . Advice Only    ref by Marjory Lies: wheezing & mold exposure: SOB w/activity: cough:    HPI:   He notes that around august of this year he started having chest congestion and and dyspnea. He thought he had a cold so he took some robitussin. He did not feel better we went to his wellness center at [facility]. He got a breathing treatment, which he felt helped with the breathing. He was prescribed prednisone taper, and augmentin.  He now feels that he has turned the  corner and feels that he is doing better.  He has concerns about mold exposure at work.  Before this happened he had no other problems with breathing.  He has been smoking up to 1.5 ppd, he has been trying to wean down, and is down to half ppd.  He takes advair twice daily and ventolin every 6 hours and they both help.   Imaging personally reviewed, chest x-ray 11/26/16; possibly some hyperinflation, otherwise lungs are normal. CBC 11/29/16; eosinophils equals 0.  He has brought in testing from work which shows environmental aspergillus, cladosporidium.   PMHX:   Past Medical History:  Diagnosis Date  . Arthritis   . Boil 04/09/2016   LEG-PT ENCOURAGED TO GET PCP TO LOOK AT LEG BEFORE UPCOMING SURGERY  . Chronic back pain    SCIATICA  . Cold   . GERD (gastroesophageal reflux disease)    occasional-TUMS PRN  . Heartburn   . Hyperlipidemia    Surgical Hx:  Past Surgical History:  Procedure Laterality Date  . BACK SURGERY    . HAND SURGERY Right   . INGUINAL HERNIA REPAIR     age 42 or so  . KNEE SURGERY    . SPERMATOCELECTOMY Right 04/19/2016   Procedure: SPERMATOCELECTOMY;  Surgeon: Hollice Espy, MD;  Location: [facility] ORS;  Service: Urology;  Laterality: Right;  . WISDOM TOOTH EXTRACTION     Family Hx:  Family History  Problem Relation Age of Onset  .  Heart disease Father   . Colon cancer Neg Hx   . Rectal cancer Neg Hx   . Stomach cancer Neg Hx    Social Hx:   Social History   Tobacco Use  . Smoking status: Current Every Day Smoker    Packs/day: 0.50    Years: 36.00    Pack years: 18.00    Types: Cigarettes  . Smokeless tobacco: Never Used  Substance Use Topics  . Alcohol use: Yes    Alcohol/week: 4.2 oz    Types: 7 Standard drinks or equivalent per week    Comment: beer, wine, liquor -2/3 BEERS/DAY  . Drug use: No   Medication:    Current Outpatient Medications:  .  acetaminophen (TYLENOL) 500 MG tablet, Take 1,000 mg by mouth 4 (four) times daily as  needed., Disp: , Rfl:  .  albuterol (PROVENTIL HFA;VENTOLIN HFA) 108 (90 Base) MCG/ACT inhaler, Inhale 2 puffs every 6 (six) hours as needed into the lungs for wheezing or shortness of breath., Disp: 1 Inhaler, Rfl: 1 .  Fluticasone-Salmeterol (ADVAIR DISKUS) 250-50 MCG/DOSE AEPB, Inhale 1 puff into the lungs 2 (two) times daily., Disp: 1 each, Rfl: 0 .  ibuprofen (ADVIL,MOTRIN) 200 MG tablet, Take 600-800 mg by mouth every 6 (six) hours as needed for moderate pain., Disp: , Rfl:  .  lidocaine (LIDODERM) 5 %, Place 1 patch onto the skin daily. Remove & Discard patch within 12 hours or as directed by MD, Disp: 14 patch, Rfl: 1   Allergies:  Latex  Review of Systems: Gen:  Denies  fever, sweats, chills HEENT: Denies blurred vision, double vision. bleeds, sore throat Cvc:  No dizziness, chest pain. Resp:   Denies cough or sputum production, shortness of breath Gi: Denies swallowing difficulty, stomach pain. Gu:  Denies bladder incontinence, burning urine Ext:   No Joint pain, stiffness. Skin: No skin rash,  hives  Endoc:  No polyuria, polydipsia. Psych: No depression, insomnia. Other:  All other systems were reviewed with the patient and were negative other that what is mentioned in the HPI.   Physical Examination:   VS: BP 138/84 (BP Location: Left Arm, Cuff Size: Normal)   Pulse 84   Ht 5\' 11"  (1.803 m)   Wt 166 lb (75.3 kg)   SpO2 99%   BMI 23.15 kg/m   General Appearance: No distress  Neuro:without focal findings,  speech normal,  HEENT: PERRLA, EOM intact.   Pulmonary: normal breath sounds, No wheezing.  CardiovascularNormal S1,S2.  No m/r/g.   Abdomen: Benign, Soft, non-tender. Renal:  No costovertebral tenderness  GU:  No performed at this time. Endoc: No evident thyromegaly, no signs of acromegaly. Skin:   warm, no rashes, no ecchymosis  Extremities: normal, no cyanosis, clubbing.  Other findings:    LABORATORY PANEL:   CBC No results for input(s): WBC, HGB,  HCT, PLT in the last 168 hours. ------------------------------------------------------------------------------------------------------------------  Chemistries  No results for input(s): NA, K, CL, CO2, GLUCOSE, BUN, CREATININE, CALCIUM, MG, AST, ALT, ALKPHOS, BILITOT in the last 168 hours.  Invalid input(s): GFRCGP ------------------------------------------------------------------------------------------------------------------  Cardiac Enzymes No results for input(s): TROPONINI in the last 168 hours. ------------------------------------------------------------  RADIOLOGY:  No results found.     Thank  you for the consultation and for allowing [facility] to assist in the care of your patient. Our recommendations are noted above.  Please contact us if we can be of further service.   Marda Stalker, MD.  Board Certified in Internal  Medicine, Pulmonary Medicine, Critical Care Medicine, and Sleep Medicine.  [facility] Office Number: [phone]  Patricia Pesa, M.D.  Merton Border, M.D  12/16/2016

## 2016-12-16 NOTE — Patient Instructions (Addendum)
--  Will send you for an aspergillus blood test, you should be off of prednisone and antihistamine for 1 week before doing this blood test.  --Continue advair and ventolin. --Follow up here or with your primary care doctor to determine if your inhalers can be cut back or stopped.

## 2016-12-17 ENCOUNTER — Telehealth: Payer: Self-pay | Admitting: Family Medicine

## 2016-12-17 NOTE — Telephone Encounter (Signed)
TA-Plz see note below/pt is requesting a return to work note without restrictions and Pulmonologist said that it had to come from PCP/is this ok? And is there anything in particular that you would want me to write if so? Plz advise/thx dmf

## 2016-12-17 NOTE — Telephone Encounter (Signed)
Copied from [place]. Topic: Quick Communication - See Telephone Encounter >> Dec 17, 2016  9:41 AM Burnis Medin, NT wrote: CRM for notification. See Telephone encounter for: Pt. Wife called in and wanted to see if the doctor could give him a note to return to work because he have been released from the Pulmonary Specialist. Specialist wouldn't write the note said the pt needed to ask his PCP for return to work note with no restrictions.  Pt. Would like a call back when note is ready for pick up or if it can be faxed. [phone]  12/17/16.

## 2016-12-22 NOTE — Telephone Encounter (Signed)
Okay to write note as requested. 

## 2016-12-22 NOTE — Progress Notes (Signed)
Letter created per TA/this will be printed at [facility]/thx dmf

## 2016-12-23 NOTE — Telephone Encounter (Signed)
Completed/pt P/U from [facility] on 12.12.2018/thx dmf

## 2016-12-24 ENCOUNTER — Telehealth: Payer: Self-pay

## 2016-12-24 NOTE — Telephone Encounter (Signed)
Pt wife, Holdyn Poyser, calls to inform Daryll Drown, PA-C that [name] has been given a note from the pulmonologist to return to work. She is inquiring as to whether the pt needs a follow up appointment to have Nira Conn approve of him returning to work, or if he can go back with the note. Per Encounter plan note, pt can return to work with note from pulmonology and clearance to wear respirator. Pt expresses that the pulmonologist advised him to wear PPE and there is a respirator available at work that he has access to and will wear.

## 2017-02-15 ENCOUNTER — Encounter: Payer: Self-pay | Admitting: Gastroenterology

## 2017-03-07 ENCOUNTER — Ambulatory Visit: Payer: Self-pay | Admitting: Registered Nurse

## 2017-03-07 ENCOUNTER — Encounter: Payer: Self-pay | Admitting: Medical

## 2017-03-07 VITALS — BP 103/69 | HR 100 | Temp 98.3°F | Resp 18 | Ht 71.0 in | Wt 166.0 lb

## 2017-03-07 DIAGNOSIS — H6691 Otitis media, unspecified, right ear: Secondary | ICD-10-CM

## 2017-03-07 DIAGNOSIS — K047 Periapical abscess without sinus: Secondary | ICD-10-CM

## 2017-03-07 MED ORDER — IBUPROFEN 200 MG PO TABS: 600.0000 mg | ORAL_TABLET | Freq: Four times a day (QID) | ORAL | 0 refills | Status: DC | PRN

## 2017-03-07 MED ORDER — ACETAMINOPHEN 500 MG PO TABS: 1000.0000 mg | ORAL_TABLET | Freq: Four times a day (QID) | ORAL | 1 refills | Status: DC | PRN

## 2017-03-07 MED ORDER — PREDNISONE 10 MG (21) PO TBPK: ORAL_TABLET | ORAL | 0 refills | Status: DC

## 2017-03-07 MED ORDER — CHLORHEXIDINE GLUCONATE 0.12 % MT SOLN: 15.0000 mL | Freq: Two times a day (BID) | OROMUCOSAL | 0 refills | Status: AC

## 2017-03-07 MED ORDER — AMOXICILLIN-POT CLAVULANATE 875-125 MG PO TABS: 1.0000 | ORAL_TABLET | Freq: Two times a day (BID) | ORAL | 0 refills | Status: DC

## 2017-03-07 MED ORDER — SALINE SPRAY 0.65 % NA SOLN: 2.0000 | NASAL | 0 refills | Status: DC

## 2017-03-07 MED ORDER — CHLORHEXIDINE GLUCONATE 0.12% ORAL RINSE (MEDLINE KIT): 15.0000 mL | Freq: Two times a day (BID) | OROMUCOSAL | 0 refills | Status: DC

## 2017-03-07 NOTE — Progress Notes (Signed)
Subjective:    Patient ID: Benjamin Ball, male    DOB: 07-21-1965, 52 y.o.   MRN: [id number]  Right upper premolar pain started Friday moved to molar area Saturday "I don't know why because I don't have any teeth there" and worsening then with right facial swelling started Saturday worsening Sunday  Has dentures partials upper and lower bilateral.  Right ear pain thinks he may have an Infection.  Taking advil 4 - 200mg   every 4 hours and still wasn't able to sleep.  Not using mouthwash.  Saw dentist a couple months ago for cleaning and was told he should have crown placed to anchor this right upper dentures  And patient refused at that time.  Denied cavities or other dental problems identified at that visit.  "My whole head hurts today"  Denied trouble eating/swallowing/breathing.  Very painful to put in upper dentures to come to work today.  Found out his eczema flare was caused by mold and once it was removed from work area eczema flare resolved.        Review of Systems  Constitutional: Negative for activity change, appetite change, chills, diaphoresis, fatigue, fever and unexpected weight change.  HENT: Positive for congestion, ear pain, facial swelling, mouth sores, sinus pressure and sinus pain. Negative for dental problem, drooling, ear discharge, hearing loss, nosebleeds, postnasal drip, rhinorrhea, sneezing, sore throat, tinnitus, trouble swallowing and voice change.   Eyes: Negative for photophobia, pain, discharge, redness, itching and visual disturbance.  Respiratory: Positive for cough. Negative for choking, chest tightness, shortness of breath, wheezing and stridor.   Cardiovascular: Negative for chest pain, palpitations and leg swelling.  Gastrointestinal: Negative for abdominal distention, abdominal pain, blood in stool, diarrhea, nausea and vomiting.  Endocrine: Negative for cold intolerance and heat intolerance.  Genitourinary: Negative for dysuria.  Musculoskeletal:  Negative for arthralgias, back pain, gait problem, joint swelling, myalgias, neck pain and neck stiffness.  Skin: Positive for color change. Negative for pallor, rash and wound.  Allergic/Immunologic: Positive for environmental allergies. Negative for food allergies and immunocompromised state.  Neurological: Negative for dizziness, tremors, seizures, syncope, facial asymmetry, speech difficulty, weakness, light-headedness, numbness and headaches.  Hematological: Negative for adenopathy. Does not bruise/bleed easily.  Psychiatric/Behavioral: Positive for sleep disturbance. Negative for agitation and confusion.       Objective:   Physical Exam  Constitutional: He is oriented to person, place, and time. Vital signs are normal. He appears well-developed and well-nourished. He is active and cooperative.  Non-toxic appearance. He does not have a sickly appearance. He appears ill. No distress.  HENT:  Head: Normocephalic and atraumatic.    Right Ear: Hearing, external ear and ear canal normal. Tympanic membrane is erythematous and bulging. A middle ear effusion is present.  Left Ear: Hearing, external ear and ear canal normal. A middle ear effusion is present.  Nose: Mucosal edema and rhinorrhea present. No nose lacerations, sinus tenderness, nasal deformity, septal deviation or nasal septal hematoma. No epistaxis.  No foreign bodies. Right sinus exhibits maxillary sinus tenderness. Right sinus exhibits no frontal sinus tenderness. Left sinus exhibits maxillary sinus tenderness. Left sinus exhibits no frontal sinus tenderness.  Mouth/Throat: Uvula is midline and mucous membranes are normal. Mucous membranes are not pale, not dry and not cyanotic. He has dentures. No oral lesions. No trismus in the jaw. Abnormal dentition. Dental abscesses present. No uvula swelling, lacerations or dental caries. Posterior oropharyngeal edema and posterior oropharyngeal erythema present. No oropharyngeal exudate or  tonsillar abscesses.  Cobblestoning posterior pharynx; bilateral tonsils 0-1+/4 bilaterally; bilateral allergic shiners; bilateral nasal turbinates scant dried mucous clear yellow;   Eyes: Conjunctivae, EOM and lids are normal. Pupils are equal, round, and reactive to light. Right eye exhibits no chemosis, no discharge, no exudate and no hordeolum. No foreign body present in the right eye. Left eye exhibits no chemosis, no discharge, no exudate and no hordeolum. No foreign body present in the left eye. Right conjunctiva is not injected. Right conjunctiva has no hemorrhage. Left conjunctiva is not injected. Left conjunctiva has no hemorrhage. No scleral icterus. Right eye exhibits normal extraocular motion and no nystagmus. Left eye exhibits normal extraocular motion and no nystagmus. Right pupil is round and reactive. Left pupil is round and reactive. Pupils are equal.  Neck: Trachea normal, normal range of motion and phonation normal. Neck supple. No tracheal tenderness, no spinous process tenderness and no muscular tenderness present. No neck rigidity. No tracheal deviation, no edema, no erythema and normal range of motion present. No thyroid mass and no thyromegaly present.  Cardiovascular: Normal rate, regular rhythm, S1 normal, S2 normal, normal heart sounds and intact distal pulses. PMI is not displaced. Exam reveals no gallop and no friction rub.  No murmur heard. Pulmonary/Chest: Effort normal. No accessory muscle usage or stridor. No respiratory distress. He has no decreased breath sounds. He has wheezes in the right middle field, the right lower field, the left middle field and the left lower field. He has no rhonchi. He has no rales.  Faint expiratory wheeze fine bilaterally equal on deep inhalation/exhalation sp02 96% room air no change with cough/deep breath/talking  Abdominal: Soft. Normal appearance. He exhibits no distension, no fluid wave and no ascites. There is no rigidity and no  guarding.  Musculoskeletal: Normal range of motion. He exhibits no edema or tenderness.       Right shoulder: Normal.       Left shoulder: Normal.       Right elbow: Normal.      Left elbow: Normal.       Right hip: Normal.       Left hip: Normal.       Right knee: Normal.       Left knee: Normal.       Cervical back: Normal.       Thoracic back: Normal.       Lumbar back: Normal.       Right hand: Normal.       Left hand: Normal.  Lymphadenopathy:       Head (right side): No submental, no submandibular, no tonsillar, no preauricular, no posterior auricular and no occipital adenopathy present.       Head (left side): No submental, no submandibular, no tonsillar, no preauricular, no posterior auricular and no occipital adenopathy present.    He has no cervical adenopathy.       Right cervical: No superficial cervical, no deep cervical and no posterior cervical adenopathy present.      Left cervical: No superficial cervical, no deep cervical and no posterior cervical adenopathy present.  Neurological: He is alert and oriented to person, place, and time. He has normal strength. He is not disoriented. He displays no atrophy and no tremor. No cranial nerve deficit or sensory deficit. He exhibits normal muscle tone. He displays no seizure activity. Coordination and gait normal. GCS eye subscore is 4. GCS verbal subscore is 5. GCS motor subscore is 6.  On/off exam table without difficulty; gait sure and  steady in hallway  Skin: Skin is warm, dry and intact. Rash noted. No abrasion, no bruising, no burn, no ecchymosis, no laceration, no lesion, no petechiae and no purpura noted. Rash is macular. Rash is not papular, not maculopapular, not nodular, not pustular, not vesicular and not urticarial. He is not diaphoretic. There is erythema. No cyanosis. No pallor. Nails show no clubbing.     Psychiatric: He has a normal mood and affect. His speech is normal and behavior is normal. Judgment and thought  content normal. Cognition and memory are normal.  Nursing note and vitals reviewed.         Assessment & Plan:  A-dental abscess and right acute otitis media  P-Take antibiotic as ordered. Electronic Rx augmentin 875mg  po BID x 10 days #20 RF0, advil 600mg  po QID or 800mg  po TID prn pain may alternate with tylenol 1000mg  po QID prn pain  OTC NSAID po TID/QID. Salt water gargles po prn.  chlorhexidate 81ml swish 30 seconds then spit out BID #473 RF0 x 16 days.  Good dental hygiene brushing, flossing, mouthwash BID.  Follow up with PCM/dentist if no improvement or worsening of symptoms e.g. fever, chills, dyspnea, SOB, purulent discharge, neck pain, eye pain.  ER if drooling/trouble breathing/swallowing.  Exitcare handout on dental abscess given to patient.  Remove upper dentures and do not wear until swelling decreased.  Prednisone 10mg  taper (60/50/40/30/20/10mg ) po daily with breakfast #21 RF0 discussed with patient Benjamin interrupt sleep especially if taken later in the day, increase heart rate and blood pressure.  Patient verbalized understanding of information/instructions agreed with plan of care and had no further questions at this time.  augmentin 875mg  po BID x 10 days #20 RF0, tylenol 1000mg  po QID prn pain may alternate with advil 600mg  po QID or 800mg  po TID OTC  Supportive treatment.   No evidence of invasive bacterial infection, non toxic and well hydrated.  This is most likely self limiting viral infection.  I do not see where any further testing or imaging is necessary at this time.   I will suggest supportive care, rest, good hygiene and encourage the patient to take adequate fluids.  The patient is to return to clinic or EMERGENCY ROOM if symptoms worsen or change significantly e.g. ear pain, fever, purulent discharge from ears or bleeding.  Exitcare handout on otitis media given to patient.  Patient verbalized agreement and understanding of treatment plan and had no further questions at  this time.  Patient may use normal saline nasal spray 2 sprays each nostril q2h wa as needed. flonase 85mcg 1 spray each nostril BID #1 RF0.  Patient denied personal or family history of ENT cancer.  OTC antihistamine of choice claritin/zyrtec 10mg  po daily.  Avoid triggers if possible.  Shower prior to bedtime if exposed to triggers.  If allergic dust/dust mites recommend mattress/pillow covers/encasements; washing linens, vacuuming, sweeping, dusting weekly.  Call or return to clinic as needed if these symptoms worsen or fail to improve as anticipated.   Exitcare handout on allergic rhinitis and sinus rinse given to patient.  Patient verbalized understanding of instructions, agreed with plan of care and had no further questions at this time.  P2:  Avoidance and hand washing.  Restart flonase 1 spray each nostril BID, saline 2 sprays each nostril q2h wa prn congestion. augmentin 875mg  po BID x 10 days #20 RF0.  Electronic Rx given.  Denied personal or family history of ENT cancer.  Shower BID especially prior to  bed. No evidence of systemic bacterial infection, non toxic and well hydrated.  I do not see where any further testing or imaging is necessary at this time.   I will suggest supportive care, rest, good hygiene and encourage the patient to take adequate fluids.  The patient is to return to clinic or EMERGENCY ROOM if symptoms worsen or change significantly.  Exitcare handout on sinusitis and sinus rinse given to patient.  Patient verbalized agreement and understanding of treatment plan and had no further questions at this time.   P2:  Hand washing and cover cough

## 2017-03-07 NOTE — Patient Instructions (Signed)
Otitis Media, Adult Otitis media occurs when there is inflammation and fluid in the middle ear. Your middle ear is a part of the ear that contains bones for hearing as well as air that helps send sounds to your brain. What are the causes? This condition is caused by a blockage in the eustachian tube. This tube drains fluid from the ear to the back of the nose (nasopharynx). A blockage in this tube can be caused by an object or by swelling (edema) in the tube. Problems that can cause a blockage include:  A cold or other upper respiratory infection.  Allergies.  An irritant, such as tobacco smoke.  Enlarged adenoids. The adenoids are areas of soft tissue located high in the back of the throat, behind the nose and the roof of the mouth.  A mass in the nasopharynx.  Damage to the ear caused by pressure changes (barotrauma). What are the signs or symptoms? Symptoms of this condition include:  Ear pain.  A fever.  Decreased hearing.  A headache.  Tiredness (lethargy).  Fluid leaking from the ear.  Ringing in the ear. How is this diagnosed? This condition is diagnosed with a physical exam. During the exam your health care provider will use an instrument called an otoscope to look into your ear and check for redness, swelling, and fluid. He or she will also ask about your symptoms. Your health care provider may also order tests, such as:  A test to check the movement of the eardrum (pneumatic otoscopy). This test is done by squeezing a small amount of air into the ear.  A test that changes air pressure in the middle ear to check how well the eardrum moves and whether the eustachian tube is working (tympanogram). How is this treated? This condition usually goes away on its own within 3-5 days. But if the condition is caused by a bacteria infection and does not go away own its own, or keeps coming back, your health care provider may:  Prescribe antibiotic medicines to treat the  infection.  Prescribe or recommend medicines to control pain. Follow these instructions at home:  Take over-the-counter and prescription medicines only as told by your health care provider.  If you were prescribed an antibiotic medicine, take it as told by your health care provider. Do not stop taking the antibiotic even if you start to feel better.  Keep all follow-up visits as told by your health care provider. This is important. Contact a health care provider if:  You have bleeding from your nose.  There is a lump on your neck.  You are not getting better in 5 days.  You feel worse instead of better. Get help right away if:  You have severe pain that is not controlled with medicine.  You have swelling, redness, or pain around your ear.  You have stiffness in your neck.  A part of your face is paralyzed.  The bone behind your ear (mastoid) is tender when you touch it.  You develop a severe headache. Summary  Otitis media is redness, soreness, and swelling of the middle ear.  This condition usually goes away on its own within 3-5 days.  If the problem does not go away in 3-5 days, your health care provider may prescribe or recommend medicines to treat your symptoms.  If you were prescribed an antibiotic medicine, take it as told by your health care provider. This information is not intended to replace advice given to you by your   to you by your health care provider. Make sure you discuss any questions you have with your health care provider. Document Released: 10/03/2003 Document Revised: 12/19/2015 Document Reviewed: 12/19/2015 Elsevier Interactive Patient Education  2018 [place] Abscess A dental abscess is a collection of pus in or around a tooth. What are the causes? This condition is caused by a bacterial infection around the root of the tooth that involves the inner part of the tooth (pulp). It may result from:  Severe tooth decay.  Trauma to the tooth that  allows bacteria to enter into the pulp, such as a broken or chipped tooth.  Severe gum disease around a tooth.  What are the signs or symptoms? Symptoms of this condition include:  Severe pain in and around the infected tooth.  Swelling and redness around the infected tooth, in the mouth, or in the face.  Tenderness.  Pus drainage.  Bad breath.  Bitter taste in the mouth.  Difficulty swallowing.  Difficulty opening the mouth.  Nausea.  Vomiting.  Chills.  Swollen neck glands.  Fever.  How is this diagnosed? This condition is diagnosed with examination of the infected tooth. During the exam, your dentist may tap on the infected tooth. Your dentist will also ask about your medical and dental history and may order X-rays. How is this treated? This condition is treated by eliminating the infection. This may be done with:  Antibiotic medicine.  A root canal. This may be performed to save the tooth.  Pulling (extracting) the tooth. This may also involve draining the abscess. This is done if the tooth cannot be saved.  Follow these instructions at home:  Take medicines only as directed by your dentist.  If you were prescribed antibiotic medicine, finish all of it even if you start to feel better.  Rinse your mouth (gargle) often with salt water to relieve pain or swelling.  Do not drive or operate heavy machinery while taking pain medicine.  Do not apply heat to the outside of your mouth.  Keep all follow-up visits as directed by your dentist. This is important. Contact a health care provider if:  Your pain is worse and is not helped by medicine. Get help right away if:  You have a fever or chills.  Your symptoms suddenly get worse.  You have a very bad headache.  You have problems breathing or swallowing.  You have trouble opening your mouth.  You have swelling in your neck or around your eye. This information is not intended to replace advice given  to you by your health care provider. Make sure you discuss any questions you have with your health care provider. Document Released: 12/28/2004 Document Revised: 05/08/2015 Document Reviewed: 12/25/2013 Elsevier Interactive Patient Education  [employer].

## 2017-04-12 ENCOUNTER — Ambulatory Visit: Payer: Self-pay | Admitting: Medical

## 2017-04-12 VITALS — BP 122/65 | HR 95 | Temp 97.7°F | Resp 18 | Wt 164.4 lb

## 2017-04-12 DIAGNOSIS — J301 Allergic rhinitis due to pollen: Secondary | ICD-10-CM

## 2017-04-12 DIAGNOSIS — H6983 Other specified disorders of Eustachian tube, bilateral: Secondary | ICD-10-CM

## 2017-04-12 MED ORDER — FLUTICASONE PROPIONATE 50 MCG/ACT NA SUSP: 2.0000 | Freq: Every day | NASAL | 2 refills | Status: DC

## 2017-04-12 MED ORDER — PREDNISONE 10 MG (21) PO TBPK: ORAL_TABLET | ORAL | 0 refills | Status: DC

## 2017-04-12 NOTE — Patient Instructions (Addendum)
Chronic Obstructive Pulmonary Disease Chronic obstructive pulmonary disease (COPD) is a long-term (chronic) lung problem. When you have COPD, it is hard for air to get in and out of your lungs. The way your lungs work will never return to normal. Usually the condition gets worse over time. There are things you can do to keep yourself as healthy as possible. Your doctor may treat your condition with:  Medicines.  Quitting smoking, if you smoke.  Rehabilitation. This may involve a team of specialists.  Oxygen.  Exercise and changes to your diet.  Lung surgery.  Comfort measures (palliative care).  Follow these instructions at home: Medicines  Take over-the-counter and prescription medicines only as told by your doctor.  Talk to your doctor before taking any cough or allergy medicines. You may need to avoid medicines that cause your lungs to be dry. Lifestyle  If you smoke, stop. Smoking makes the problem worse. If you need help quitting, ask your doctor.  Avoid being around things that make your breathing worse. This may include smoke, chemicals, and fumes.  Stay active, but remember to also rest.  Learn and use tips on how to relax.  Make sure you get enough sleep. Most adults need at least 7 hours a night.  Eat healthy foods. Eat smaller meals more often. Rest before meals. Controlled breathing  Learn and use tips on how to control your breathing as told by your doctor. Try: ? Breathing in (inhaling) through your nose for 1 second. Then, pucker your lips and breath out (exhale) through your lips for 2 seconds. ? Putting one hand on your belly (abdomen). Breathe in slowly through your nose for 1 second. Your hand on your belly should move out. Pucker your lips and breathe out slowly through your lips. Your hand on your belly should move in as you breathe out. Controlled coughing  Learn and use controlled coughing to clear mucus from your lungs. The steps are: 1. Lean your  head a little forward. 2. Breathe in deeply. 3. Try to hold your breath for 3 seconds. 4. Keep your mouth slightly open while coughing 2 times. 5. Spit any mucus out into a tissue. 6. Rest and do the steps again 1 or 2 times as needed. General instructions  Make sure you get all the shots (vaccines) that your doctor recommends. Ask your doctor about a flu shot and a pneumonia shot.  Use oxygen therapy and therapy to help improve your lungs (pulmonary rehabilitation) if told by your doctor. If you need home oxygen therapy, ask your doctor if you should buy a tool to measure your oxygen level (oximeter).  Make a COPD action plan with your doctor. This helps you know what to do if you feel worse than usual.  Manage any other conditions you have as told by your doctor.  Avoid going outside when it is very hot, cold, or humid.  Avoid people who have a sickness you can catch (contagious).  Keep all follow-up visits as told by your doctor. This is important. Contact a doctor if:  You cough up more mucus than usual.  There is a change in the color or thickness of the mucus.  It is harder to breathe than usual.  Your breathing is faster than usual.  You have trouble sleeping.  You need to use your medicines more often than usual.  You have trouble doing your normal activities such as getting dressed or walking around the house. Get help right away if:    You have shortness of breath while resting.  You have shortness of breath that stops you from: ? Being able to talk. ? Doing normal activities.  Your chest hurts for longer than 5 minutes.  Your skin color is more blue than usual.  Your pulse oximeter shows that you have low oxygen for longer than 5 minutes.  You have a fever.  You feel too tired to breathe normally. Summary  Chronic obstructive pulmonary disease (COPD) is a long-term lung problem.  The way your lungs work will never return to normal. Usually the  condition gets worse over time. There are things you can do to keep yourself as healthy as possible.  Take over-the-counter and prescription medicines only as told by your doctor.  If you smoke, stop. Smoking makes the problem worse. This information is not intended to replace advice given to you by your health care provider. Make sure you discuss any questions you have with your health care provider. Document Released: 06/16/2007 Document Revised: 06/05/2015 Document Reviewed: 08/24/2012 Elsevier Interactive Patient Education  2017 [place]. Eustachian Tube Dysfunction The eustachian tube connects the middle ear to the back of the nose. It regulates air pressure in the middle ear by allowing air to move between the ear and nose. It also helps to drain fluid from the middle ear space. When the eustachian tube does not function properly, air pressure, fluid, or both can build up in the middle ear. Eustachian tube dysfunction can affect one or both ears. What are the causes? This condition happens when the eustachian tube becomes blocked or cannot open normally. This may result from:  Ear infections.  Colds and other upper respiratory infections.  Allergies.  Irritation, such as from cigarette smoke or acid from the stomach coming up into the esophagus (gastroesophageal reflux).  Sudden changes in air pressure, such as from descending in an airplane.  Abnormal growths in the nose or throat, such as nasal polyps, tumors, or enlarged tissue at the back of the throat (adenoids).  What increases the risk? This condition may be more likely to develop in people who smoke and people who are overweight. Eustachian tube dysfunction may also be more likely to develop in children, especially children who have:  Certain birth defects of the mouth, such as cleft palate.  Large tonsils and adenoids.  What are the signs or symptoms? Symptoms of this condition may include:  A feeling of  fullness in the ear.  Ear pain.  Clicking or popping noises in the ear.  Ringing in the ear.  Hearing loss.  Loss of balance.  Symptoms may get worse when the air pressure around you changes, such as when you travel to an area of high elevation or fly on an airplane. How is this diagnosed? This condition may be diagnosed based on:  Your symptoms.  A physical exam of your ear, nose, and throat.  Tests, such as those that measure: ? The movement of your eardrum (tympanogram). ? Your hearing (audiometry).  How is this treated? Treatment depends on the cause and severity of your condition. If your symptoms are mild, you may be able to relieve your symptoms by moving air into ("popping") your ears. If you have symptoms of fluid in your ears, treatment may include:  Decongestants.  Antihistamines.  Nasal sprays or ear drops that contain medicines that reduce swelling (steroids).  In some cases, you may need to have a procedure to drain the fluid in your eardrum (myringotomy). In this  procedure, a small tube is placed in the eardrum to:  Drain the fluid.  Restore the air in the middle ear space.  Follow these instructions at home:  Take over-the-counter and prescription medicines only as told by your health care provider.  Use techniques to help pop your ears as recommended by your health care provider. These may include: ? Chewing gum. ? Yawning. ? Frequent, forceful swallowing. ? Closing your mouth, holding your nose closed, and gently blowing as if you are trying to blow air out of your nose.  Do not do any of the following until your health care provider approves: ? Travel to high altitudes. ? Fly in airplanes. ? Work in a [employer] or room. ? Scuba dive.  Keep your ears dry. Dry your ears completely after showering or bathing.  Do not smoke.  Keep all follow-up visits as told by your health care provider. This is important. Contact a health care  provider if:  Your symptoms do not go away after treatment.  Your symptoms come back after treatment.  You are unable to pop your ears.  You have: ? A fever. ? Pain in your ear. ? Pain in your head or neck. ? Fluid draining from your ear.  Your hearing suddenly changes.  You become very dizzy.  You lose your balance. This information is not intended to replace advice given to you by your health care provider. Make sure you discuss any questions you have with your health care provider. Document Released: 01/24/2015 Document Revised: 06/05/2015 Document Reviewed: 01/16/2014 Elsevier Interactive Patient Education  2018 [employer]. Smoking Tobacco Information Smoking tobacco will very likely harm your health. Tobacco contains a poisonous (toxic), colorless chemical called nicotine. Nicotine affects the brain and makes tobacco addictive. This change in your brain can make it hard to stop smoking. Tobacco also has other toxic chemicals that can hurt your body and raise your risk of many cancers. How can smoking tobacco affect me? Smoking tobacco can increase your chances of having serious health conditions, such as:  Cancer. Smoking is most commonly associated with lung cancer, but can lead to cancer in other parts of the body.  Chronic obstructive pulmonary disease (COPD). This is a long-term lung condition that makes it hard to breathe. It also gets worse over time.  High blood pressure (hypertension), heart disease, stroke, or heart attack.  Lung infections, such as pneumonia.  Cataracts. This is when the lenses in the eyes become clouded.  Digestive problems. This may include peptic ulcers, heartburn, and gastroesophageal reflux disease (GERD).  Oral health problems, such as gum disease and tooth loss.  Loss of taste and smell.  Smoking can affect your appearance by causing:  Wrinkles.  Yellow or stained teeth, fingers, and fingernails.  Smoking tobacco can also  affect your social life.  Many workplaces, [employer], hotels, and public places are tobacco-free. This means that you may experience challenges in finding places to smoke when away from home.  The cost of a smoking habit can be expensive. Expenses for someone who smokes come in two ways: ? You spend money on a regular basis to buy tobacco. ? Your health care costs in the long-term are higher if you smoke.  Tobacco smoke can also affect the health of those around you. Children of smokers have greater chances of: ? Sudden infant death syndrome (SIDS). ? Ear infections. ? Lung infections.  What lifestyle changes can be made?  Do not start smoking. Quit if you  already do.  To quit smoking: ? Make a plan to quit smoking and commit yourself to it. Look for programs to help you and ask your health care provider for recommendations and ideas. ? Talk with your health care provider about using nicotine replacement medicines to help you quit. Medicine replacement medicines include gum, lozenges, patches, sprays, or pills. ? Do not replace cigarette smoking with electronic cigarettes, which are commonly called e-cigarettes. The safety of e-cigarettes is not known, and some may contain harmful chemicals. ? Avoid places, people, or situations that tempt you to smoke. ? If you try to quit but return to smoking, don't give up hope. It is very common for people to try a number of times before they fully succeed. When you feel ready again, give it another try.  Quitting smoking might affect the way you eat as well as your weight. Be prepared to monitor your eating habits. Get support in planning and following a healthy diet.  Ask your health care provider about having regular tests (screenings) to check for cancer. This may include blood tests, imaging tests, and other tests.  Exercise regularly. Consider taking walks, joining a gym, or doing yoga or exercise classes.  Develop skills to manage your  stress. These skills include meditation. What are the benefits of quitting smoking? By quitting smoking, you may:  Lower your risk of getting cancer and other diseases caused by smoking.  Live longer.  Breathe better.  Lower your blood pressure and heart rate.  Stop your addiction to tobacco.  Stop creating secondhand smoke that hurts other people.  Improve your sense of taste and smell.  Look better over time, due to having fewer wrinkles and less staining.  What can happen if changes are not made? If you do not stop smoking, you may:  Get cancer and other diseases.  Develop COPD or other long-term (chronic) lung conditions.  Develop serious problems with your heart and blood vessels (cardiovascular system).  Need more tests to screen for problems caused by smoking.  Have higher, long-term healthcare costs from medicines or treatments related to smoking.  Continue to have worsening changes in your lungs, mouth, and nose.  Where to find support: To get support to quit smoking, consider:  Asking your health care provider for more information and resources.  Taking classes to learn more about quitting smoking.  Looking for local organizations that offer resources about quitting smoking.  Joining a support group for people who want to quit smoking in your local community.  Where to find more information: You may find more information about quitting smoking from:  HelpGuide.org: www.helpguide.org/articles/addictions/how-to-quit-smoking.htm  https://hall.com/: smokefree.gov  American Lung Association: www.lung.org  Contact a health care provider if:  You have problems breathing.  Your lips, nose, or fingers turn blue.  You have chest pain.  You are coughing up blood.  You feel faint or you pass out.  You have other noticeable changes that cause you to worry. Summary  Smoking tobacco can negatively affect your health, the health of those around you, your  finances, and your social life.  Do not start smoking. Quit if you already do. If you need help quitting, ask your health care provider.  Think about joining a support group for people who want to quit smoking in your local community. There are many effective programs that will help you to quit this behavior. This information is not intended to replace advice given to you by your health care provider. Make sure  you discuss any questions you have with your health care provider. Document Released: 01/13/2016 Document Revised: 01/13/2016 Document Reviewed: 01/13/2016 Elsevier Interactive Patient Education  2018 [employer]. Bronchospasm, Adult Bronchospasm is a tightening of the airways going into the lungs. During an episode, it may be harder to breathe. You may cough, and you may make a whistling sound when you breathe (wheeze). This condition often affects people with asthma. What are the causes? This condition is caused by swelling and irritation in the airways. It can be triggered by:  An infection (common).  Seasonal allergies.  An allergic reaction.  Exercise.  Irritants. These include pollution, cigarette smoke, strong odors, aerosol sprays, and paint fumes.  Weather changes. Winds increase molds and pollens in the air. Cold air may cause swelling.  Stress and emotional upset.  What are the signs or symptoms? Symptoms of this condition include:  Wheezing. If the episode was triggered by an allergy, wheezing may start right away or hours later.  Nighttime coughing.  Frequent or severe coughing with a simple cold.  Chest tightness.  Shortness of breath.  Decreased ability to exercise.  How is this diagnosed? This condition is usually diagnosed with a review of your medical history and a physical exam. Tests, such as lung function tests, are sometimes done to look for other conditions. The need for a chest X-ray depends on where the wheezing occurs and whether it is  the first time you have wheezed. How is this treated? This condition may be treated with:  Inhaled medicines. These open up the airways and help you breathe. They can be taken with an inhaler or a nebulizer device.  Corticosteroid medicines. These may be given for severe bronchospasm, usually when it is associated with asthma.  Avoiding triggers, such as irritants, infection, or allergies.  Follow these instructions at home: Medicines  Take over-the-counter and prescription medicines only as told by your health care provider.  If you need to use an inhaler or nebulizer to take your medicine, ask your health care provider to explain how to use it correctly. If you were given a spacer, always use it with your inhaler. Lifestyle  Reduce the number of triggers in your home. To do this: ? Change your heating and air conditioning filter at least once a month. ? Limit your use of fireplaces and wood stoves. ? Do not smoke. Do not allow smoking in your home. ? Avoid using perfumes and fragrances. ? Get rid of pests, such as roaches and mice, and their droppings. ? Remove any mold from your home. ? Keep your house clean and dust free. Use unscented cleaning products. ? Replace carpet with wood, tile, or vinyl flooring. Carpet can trap dander and dust. ? Use allergy-proof pillows, mattress covers, and box spring covers. ? Wash bed sheets and blankets every week in hot water. Dry them in a dryer. ? Use blankets that are made of polyester or cotton. ? Wash your hands often. ? Do not allow pets in your bedroom.  Avoid breathing in cold air when you exercise. General instructions  Have a plan for seeking medical care. Know when to call your health care provider and local emergency services, and where to get emergency care.  Stay up to date on your immunizations.  When you have an episode of bronchospasm, stay calm. Try to relax and breathe more slowly.  If you have asthma, make sure you  have an asthma action plan.  Keep all follow-up visits as told  by your health care provider. This is important. Contact a health care provider if:  You have muscle aches.  You have chest pain.  The mucus that you cough up (sputum) changes from clear or white to yellow, green, gray, or bloody.  You have a fever.  Your sputum gets thicker. Get help right away if:  Your wheezing and coughing get worse, even after you take your prescribed medicines.  It gets even harder to breathe.  You develop severe chest pain. Summary  Bronchospasm is a tightening of the airways going into the lungs.  During an episode of bronchospasm, you may have a harder time breathing. You may cough and make a whistling sound when you breathe (wheeze).  Avoid exposure to triggers such as smoke, dust, mold, animal dander, and fragrances.  When you have an episode of bronchospasm, stay calm. Try to relax and breathe more slowly. This information is not intended to replace advice given to you by your health care provider. Make sure you discuss any questions you have with your health care provider. Document Released: 12/31/2002 Document Revised: 12/25/2015 Document Reviewed: 12/25/2015 Elsevier Interactive Patient Education  2017 [employer] Viral Illness, Adult Viruses are tiny germs that can get into a person's body and cause illness. There are many different types of viruses, and they cause many types of illness. Viral illnesses can range from mild to severe. They can affect various parts of the body. Common illnesses that are caused by a virus include colds and the flu. Viral illnesses also include serious conditions such as HIV/AIDS (human immunodeficiency virus/acquired immunodeficiency syndrome). A few viruses have been linked to certain cancers. What are the causes? Many types of viruses can cause illness. Viruses invade cells in your body, multiply, and cause the infected cells to malfunction or die.  When the cell dies, it releases more of the virus. When this happens, you develop symptoms of the illness, and the virus continues to spread to other cells. If the virus takes over the function of the cell, it can cause the cell to divide and grow out of control, as is the case when a virus causes cancer. Different viruses get into the body in different ways. You can get a virus by:  Swallowing food or water that is contaminated with the virus.  Breathing in droplets that have been coughed or sneezed into the air by an infected person.  Touching a surface that has been contaminated with the virus and then touching your eyes, nose, or mouth.  Being bitten by an insect or animal that carries the virus.  Having sexual contact with a person who is infected with the virus.  Being exposed to blood or fluids that contain the virus, either through an open cut or during a transfusion.  If a virus enters your body, your body's defense system (immune system) will try to fight the virus. You may be at higher risk for a viral illness if your immune system is weak. What are the signs or symptoms? Symptoms vary depending on the type of virus and the location of the cells that it invades. Common symptoms of the main types of viral illnesses include: Cold and flu viruses  Fever.  Headache.  Sore throat.  Muscle aches.  Nasal congestion.  Cough. Digestive system (gastrointestinal) viruses  Fever.  Abdominal pain.  Nausea.  Diarrhea. Liver viruses (hepatitis)  Loss of appetite.  Tiredness.  Yellowing of the skin (jaundice). Brain and spinal cord viruses  Fever.  Headache.  Stiff neck.  Nausea and vomiting.  Confusion or sleepiness. Skin viruses  Warts.  Itching.  Rash. Sexually transmitted viruses  Discharge.  Swelling.  Redness.  Rash. How is this treated? Viruses can be difficult to treat because they live within cells. Antibiotic medicines do not treat  viruses because these drugs do not get inside cells. Treatment for a viral illness may include:  Resting and drinking plenty of fluids.  Medicines to relieve symptoms. These can include over-the-counter medicine for pain and fever, medicines for cough or congestion, and medicines to relieve diarrhea.  Antiviral medicines. These drugs are available only for certain types of viruses. They may help reduce flu symptoms if taken early. There are also many antiviral medicines for hepatitis and HIV/AIDS.  Some viral illnesses can be prevented with vaccinations. A common example is the flu shot. Follow these instructions at home: Medicines   Take over-the-counter and prescription medicines only as told by your health care provider.  If you were prescribed an antiviral medicine, take it as told by your health care provider. Do not stop taking the medicine even if you start to feel better.  Be aware of when antibiotics are needed and when they are not needed. Antibiotics do not treat viruses. If your health care provider thinks that you may have a bacterial infection as well as a viral infection, you may get an antibiotic. ? Do not ask for an antibiotic prescription if you have been diagnosed with a viral illness. That will not make your illness go away faster. ? Frequently taking antibiotics when they are not needed can lead to antibiotic resistance. When this develops, the medicine no longer works against the bacteria that it normally fights. General instructions  Drink enough fluids to keep your urine clear or pale yellow.  Rest as much as possible.  Return to your normal activities as told by your health care provider. Ask your health care provider what activities are safe for you.  Keep all follow-up visits as told by your health care provider. This is important. How is this prevented? Take these actions to reduce your risk of viral infection:  Eat a healthy diet and get enough  rest.  Wash your hands often with soap and water. This is especially important when you are in public places. If soap and water are not available, use hand sanitizer.  Avoid close contact with friends and family who have a viral illness.  If you travel to areas where viral gastrointestinal infection is common, avoid drinking water or eating raw food.  Keep your immunizations up to date. Get a flu shot every year as told by your health care provider.  Do not share toothbrushes, nail clippers, razors, or needles with other people.  Always practice safe sex.  Contact a health care provider if:  You have symptoms of a viral illness that do not go away.  Your symptoms come back after going away.  Your symptoms get worse. Get help right away if:  You have trouble breathing.  You have a severe headache or a stiff neck.  You have severe vomiting or abdominal pain. This information is not intended to replace advice given to you by your health care provider. Make sure you discuss any questions you have with your health care provider. Document Released: 05/09/2015 Document Revised: 06/11/2015 Document Reviewed: 05/09/2015 Elsevier Interactive Patient Education  [employer].

## 2017-04-12 NOTE — Progress Notes (Signed)
Subjective:    Patient ID: Benjamin Ball, male    DOB: 12-14-1965, 52 y.o.   MRN: [id number]  HPI  52 yo male in non acute distress. Smoking upto a ppd.  Complaining of  2 weeks of congestions  And sharp pain in ears mostly left ears lasting  5 minutes or so then resolves getting it  2 times / day..  runny nose clear , coughing clear, no fever or chills, Deneise shortness of breath or chest pian. Took a multipe cold medicine without food on an empty stomach then took  4 Extra strength Excedrin yesterday, ate something quickly, and felt better.   Took two Advil 200mg  this morning ( 9am).   Review of Systems  Constitutional: Positive for chills. Negative for fever.  HENT: Positive for congestion, postnasal drip, rhinorrhea, sinus pain, sneezing, sore throat (mostly at night ) and voice change (rasspy sounding per patient).   Eyes: Negative for discharge and itching.  Respiratory: Positive for cough. Negative for shortness of breath and wheezing.   Cardiovascular: Negative for chest pain and leg swelling.  Gastrointestinal: Negative for abdominal pain, constipation and diarrhea.  Endocrine: Negative for polydipsia, polyphagia and polyuria.  Genitourinary: Negative for dysuria.  Musculoskeletal: Positive for myalgias (all over  thought it was due to the weather).  Skin: Negative for rash.  Allergic/Immunologic: Positive for environmental allergies.  Neurological: Positive for headaches. Negative for dizziness, syncope and light-headedness.  Hematological: Negative for adenopathy.  Psychiatric/Behavioral: Negative for behavioral problems, self-injury and suicidal ideas. The patient is not nervous/anxious (little bit).        Objective:   Physical Exam  Constitutional: He is oriented to person, place, and time. He appears well-developed and well-nourished.  HENT:  Head: Normocephalic and atraumatic.  Right Ear: External ear normal.  Left Ear: External ear normal.  Mouth/Throat:  Oropharynx is clear and moist.  Eyes: Pupils are equal, round, and reactive to light. Conjunctivae and EOM are normal.  Neck: Normal range of motion. Neck supple.  Cardiovascular: Normal rate, regular rhythm and normal heart sounds.  Pulmonary/Chest: Effort normal. He has wheezes.  Neurological: He is alert and oriented to person, place, and time.  Skin: Skin is warm and dry.  Psychiatric: He has a normal mood and affect. His behavior is normal. Judgment and thought content normal.          Assessment & Plan:   Viral Ilness,  Eustachian tube dysfunction, smoker, allergic rhinitis  COPD information given to patient No pain now in ears. Will place patient on Steroid taper to help with ETD and wheezing..  recommended patient not to smoke  2 hours before coming into clinic.To follow up with  Pulmonary or family doctor within the next  2 weeks. Advair twice daily and Ventolin every 6-8 hours /day.  Try to cut down on smoking. Use OTC Zyrtec or Claritin daily with OTC Flonase take as directed.  Meds ordered this encounter  Medications  . predniSONE (STERAPRED UNI-PAK 21 TAB) 10 MG (21) TBPK tablet    Sig: Take  6 tablets by mouth today then  5 tablets tomorrow then one less each day thereafter. Take with food.    Dispense:  21 tablet    Refill:  0  . fluticasone (FLONASE) 50 MCG/ACT nasal spray    Sig: Place 2 sprays into both nostrils daily.    Dispense:  16 g    Refill:  2  If ear pain is constant or fever to call  the clinic or return to the clinic for antibiotics.  Patient verbalizes understanding and has no questions at discharge.

## 2017-05-26 ENCOUNTER — Ambulatory Visit: Payer: Self-pay | Admitting: Adult Health

## 2017-05-26 VITALS — Resp 16

## 2017-05-26 DIAGNOSIS — Z026 Encounter for examination for insurance purposes: Secondary | ICD-10-CM

## 2017-05-26 NOTE — Progress Notes (Signed)
[employer] - see paper chart Benjamin Ball  For documentation. Not to put in Epic per Theodis Sato RN/ policy in place.

## 2017-08-21 IMAGING — MR MR LUMBAR SPINE W/O CM
5 series · 48 of 48 positions shown · non-contrast
Comparison: Lumbar MRI 10/04/2012.

CLINICAL DATA: Chronic bilateral low back pain, worse on the left.
Back surgery November 2012 with persistent symptoms.

EXAM:
MRI LUMBAR SPINE WITHOUT CONTRAST
TECHNIQUE: Multiplanar, multisequence MR imaging of the lumbar spine was
performed. No intravenous contrast was administered.

[Series 3: T2 · sagittal · 4.0mm · 0.88mm/px · 6 of 15 slices shown (1 of 2)]
[im 1/15]
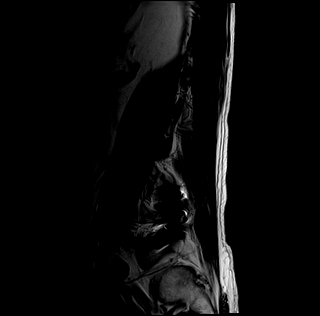
[im 3/15]
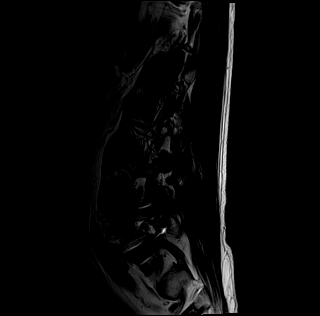
[im 6/15]
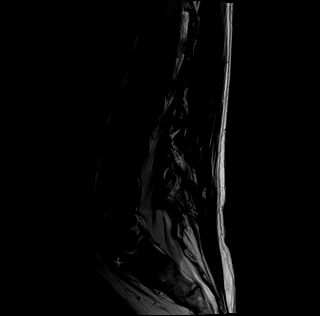
[im 9/15]
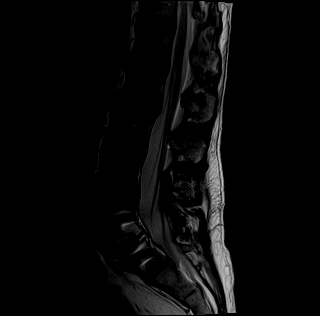
[im 12/15]
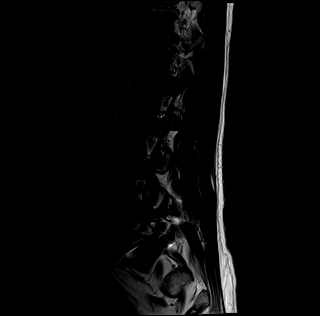
[im 15/15]
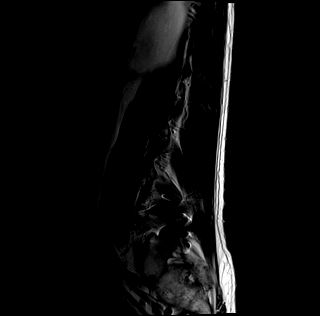

[Series 4: T1 · sagittal · 4.0mm · 0.88mm/px · 6 of 15 slices shown (1 of 2)]
[im 1/15]
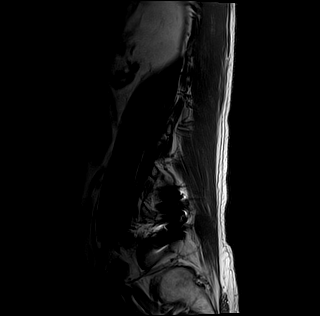
[im 3/15]
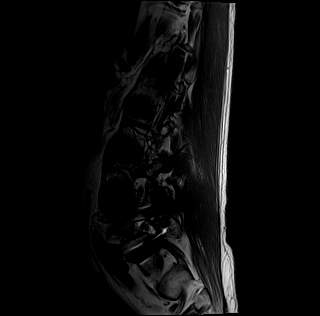
[im 6/15]
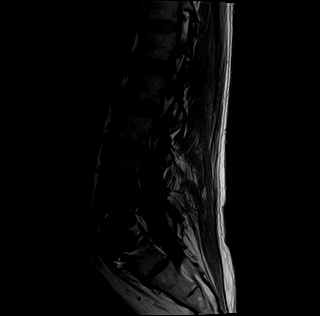
[im 9/15]
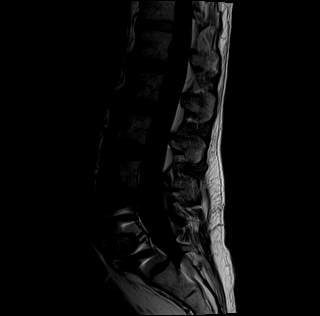
[im 12/15]
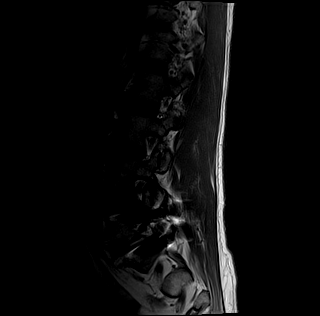
[im 15/15]
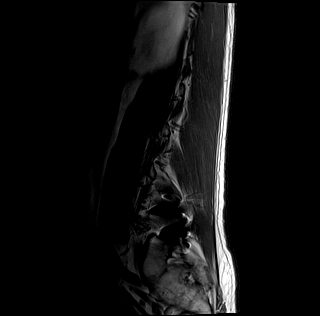

[Series 5: tirm sag · sagittal · 4.0mm · 0.88mm/px · 6 of 15 slices shown]
[im 1/15]
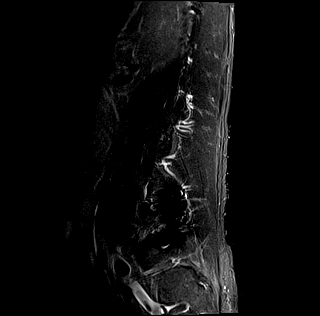
[im 3/15]
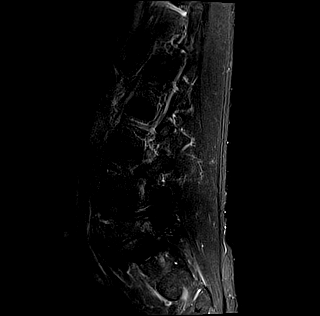
[im 6/15]
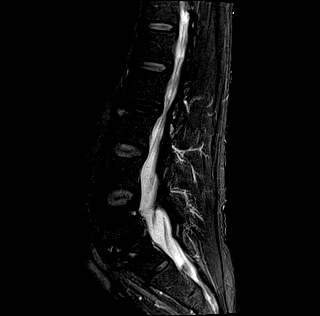
[im 9/15]
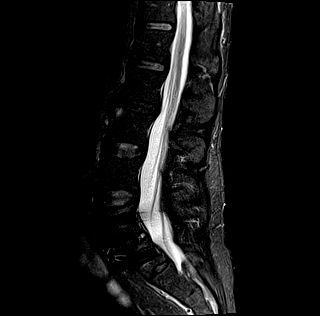
[im 12/15]
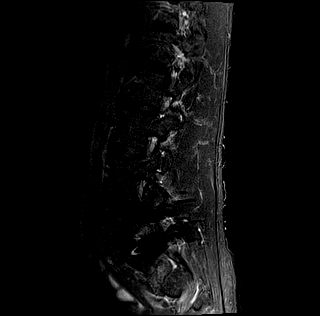
[im 15/15]
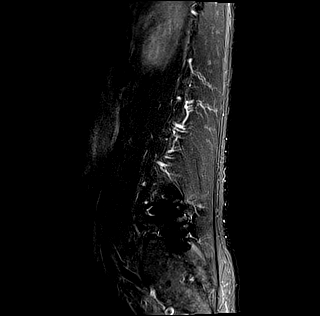

[Series 6: T2 · axial · 4.0mm · 0.70mm/px · z∈[-110,+97]mm · 15 of 40 slices shown (2 of 2)]
[im 1/40]
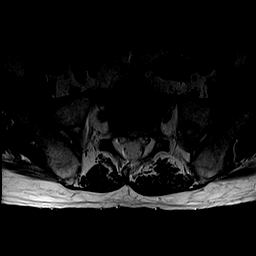
[im 3/40]
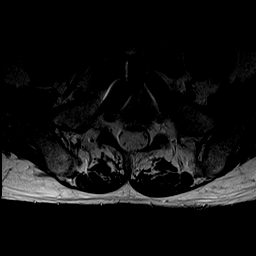
[im 6/40]
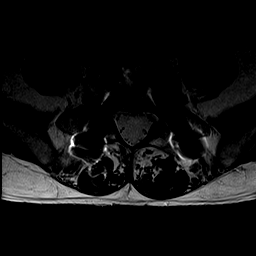
[im 9/40]
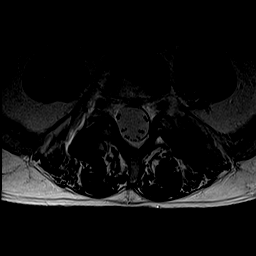
[im 12/40]
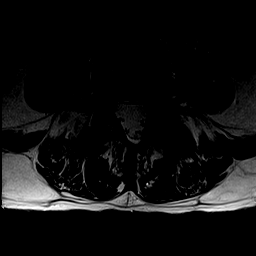
[im 14/40]
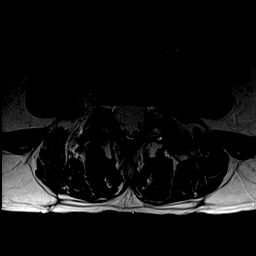
[im 17/40]
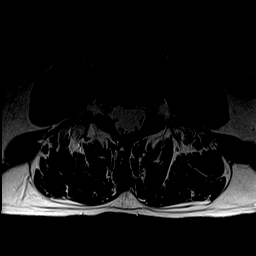
[im 20/40]
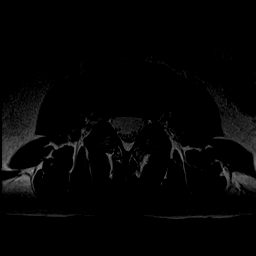
[im 23/40]
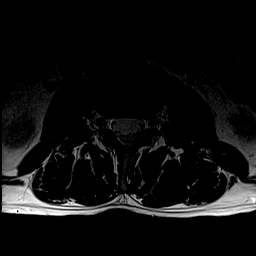
[im 26/40]
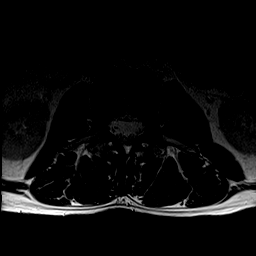
[im 28/40]
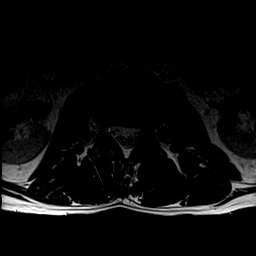
[im 31/40]
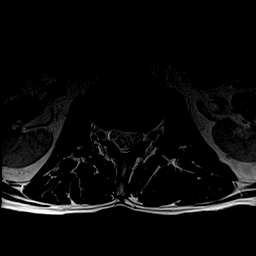
[im 34/40]
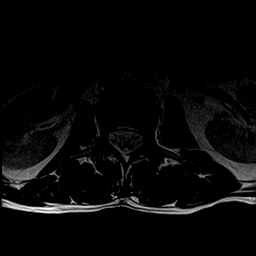
[im 37/40]
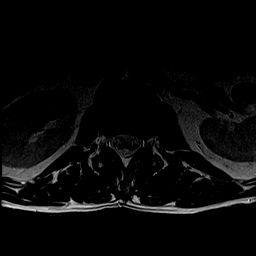
[im 40/40]
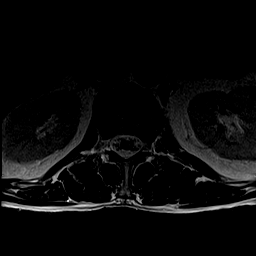

[Series 7: T1 · axial · 4.0mm · 0.70mm/px · z∈[-110,+97]mm · 15 of 40 slices shown (2 of 2)]
[im 1/40]
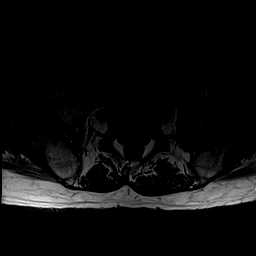
[im 3/40]
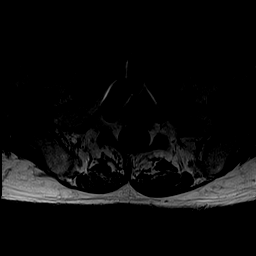
[im 6/40]
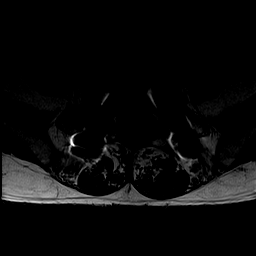
[im 9/40]
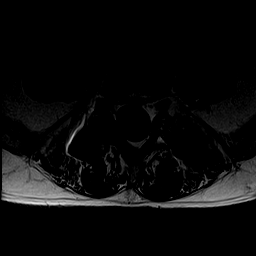
[im 12/40]
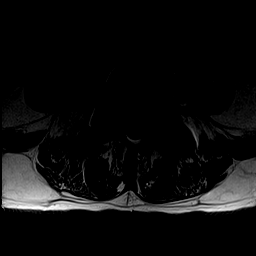
[im 14/40]
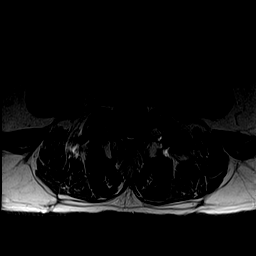
[im 17/40]
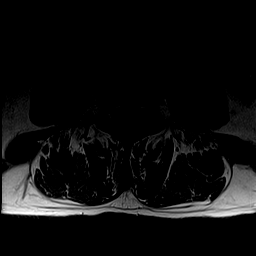
[im 20/40]
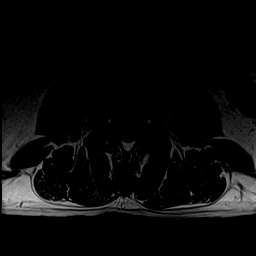
[im 23/40]
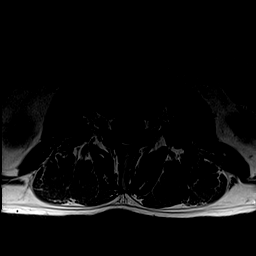
[im 26/40]
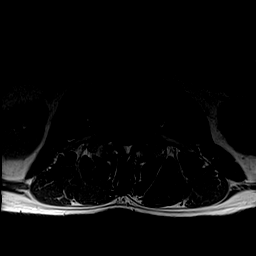
[im 28/40]
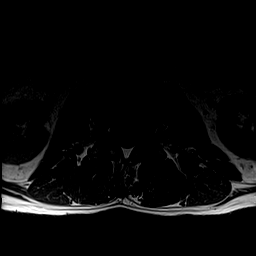
[im 31/40]
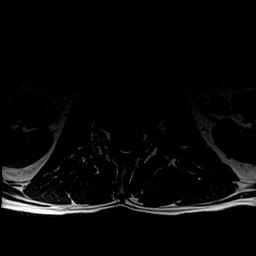
[im 34/40]
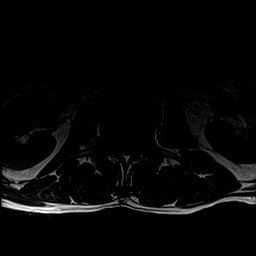
[im 37/40]
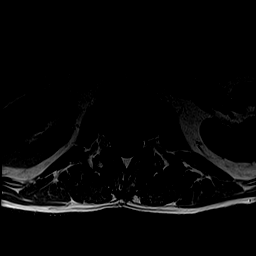
[im 40/40]
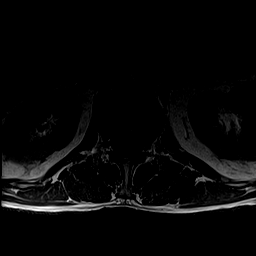

[48 of 48 positions shown; findings below may reference images not displayed]

FINDINGS: Segmentation: Conventional anatomy assumed, with the last open disc
space designated L5-S1. State numbering utilized previously is
applied to the current examination.

Alignment: Interval improved anterolisthesis at L5-S1 status post
anterior and posterior lumbar fusion for pars defects. Otherwise
normal.

Vertebrae: No acute or worrisome osseous findings. Mild endplate
edema in the anterior superior corner of the L3 vertebral body. The
visualized sacroiliac joints appear unremarkable.

Conus medullaris: Extends to the L2-3 level and appears normal.

Paraspinal and other soft tissues: No significant paraspinal
findings.

Disc levels:

No significant disc space findings at T12-L1 or L1-2.

L2-3: Mildly progressive disc desiccation with loss of disc height
and annular disc bulging. Previously demonstrated central disc
extrusion with cephalad extension behind the L2 vertebral body has
slightly decreased in size. No resulting spinal stenosis or nerve
root encroachment.

L3-4: Normal interspace.

L4-5: Normal interspace.

L5-S1: Interval anterior and posterior lumbar fusion for pars
defects. The anterolisthesis is improved. There is no significant
residual foraminal compromise or exiting L5 nerve root encroachment.
The spinal canal and lateral recesses are widely patent.
IMPRESSION: 1. Interval lumbar fusion at L5-S1 for L5 pars defects. The
anterolisthesis has improved, and there is no residual foraminal
compromise or nerve root encroachment.
2. Partial involution of central disc extrusion at L2-3 without
nerve root encroachment. Mildly progressive overall disc
degeneration.
3. No acute or other significant disc space findings.

## 2017-08-22 ENCOUNTER — Ambulatory Visit: Payer: BLUE CROSS/BLUE SHIELD | Admitting: Internal Medicine

## 2017-08-22 ENCOUNTER — Encounter: Payer: Self-pay | Admitting: Internal Medicine

## 2017-08-22 DIAGNOSIS — J449 Chronic obstructive pulmonary disease, unspecified: Secondary | ICD-10-CM | POA: Diagnosis not present

## 2017-08-22 DIAGNOSIS — K219 Gastro-esophageal reflux disease without esophagitis: Secondary | ICD-10-CM

## 2017-08-22 DIAGNOSIS — M5442 Lumbago with sciatica, left side: Secondary | ICD-10-CM | POA: Diagnosis not present

## 2017-08-22 DIAGNOSIS — E7800 Pure hypercholesterolemia, unspecified: Secondary | ICD-10-CM | POA: Diagnosis not present

## 2017-08-22 DIAGNOSIS — M549 Dorsalgia, unspecified: Secondary | ICD-10-CM

## 2017-08-22 DIAGNOSIS — G8929 Other chronic pain: Secondary | ICD-10-CM

## 2017-08-22 NOTE — Assessment & Plan Note (Signed)
Encouraged stretching and regular physical activity Heat may be helpful

## 2017-08-22 NOTE — Assessment & Plan Note (Signed)
Continue Tums as needed

## 2017-08-22 NOTE — Progress Notes (Signed)
HPI  Pt presents to the clinic today to establish care and for management of the conditions listed below. He is transferring care from Dr. Deborra Medina.  Arthritis/Chronic Back Pain: He has had surgery 11/2012. He smokes weed daily to help control the pain.  GERD: Triggered by greasy, spicy and tomato based foods. He takes Tums as needed with good relief.   HLD: His last LDL was 142, 12/2013. He is not taking any cholesterol lowering medications at this time. He does not consume a low fat diet.  COPD: He is taking Advair and Albuterol as needed. He follows with pulmonology. There is no spirometry on file. He does smoke.  Flu: 10/2015 Tetanus: 07/2012 Pneumovax: ? 2014 PSA Screening: 12/2013 Colon Screening: 01/2014 Vision Screening: as needed Dentist: biannually  Past Medical History:  Diagnosis Date  . Arthritis   . Boil 04/09/2016   LEG-PT ENCOURAGED TO GET PCP TO LOOK AT LEG BEFORE UPCOMING SURGERY  . Chronic back pain    SCIATICA  . Cold   . GERD (gastroesophageal reflux disease)    occasional-TUMS PRN  . Heartburn   . Hyperlipidemia     Current Outpatient Medications  Medication Sig Dispense Refill  . albuterol (PROVENTIL HFA;VENTOLIN HFA) 108 (90 Base) MCG/ACT inhaler Inhale 2 puffs every 6 (six) hours as needed into the lungs for wheezing or shortness of breath. 1 Inhaler 1  . fluticasone (FLONASE) 50 MCG/ACT nasal spray Place 2 sprays into both nostrils daily. 16 g 2  . Fluticasone-Salmeterol (ADVAIR DISKUS) 250-50 MCG/DOSE AEPB Inhale 1 puff into the lungs 2 (two) times daily. 1 each 0  . ibuprofen (ADVIL,MOTRIN) 200 MG tablet Take 3-4 tablets (600-800 mg total) by mouth every 6 (six) hours as needed for moderate pain. 30 tablet 0  . sodium chloride (OCEAN) 0.65 % SOLN nasal spray Place 2 sprays into both nostrils every 2 (two) hours while awake.  0   No current facility-administered medications for this visit.     Allergies  Allergen Reactions  . Latex     WEARING  GLOVES FOR AN EXTENDED PERIOD OF TIME    Family History  Problem Relation Age of Onset  . Heart disease Father   . Colon cancer Neg Hx   . Rectal cancer Neg Hx   . Stomach cancer Neg Hx     Social History   Socioeconomic History  . Marital status: Married    Spouse name: Not on file  . Number of children: Not on file  . Years of education: Not on file  . Highest education level: Not on file  Occupational History  . Not on file  Social Needs  . Financial resource strain: Not on file  . Food insecurity:    Worry: Not on file    Inability: Not on file  . Transportation needs:    Medical: Not on file    Non-medical: Not on file  Tobacco Use  . Smoking status: Current Every Day Smoker    Packs/day: 0.50    Years: 36.00    Pack years: 18.00    Types: Cigarettes  . Smokeless tobacco: Never Used  Substance and Sexual Activity  . Alcohol use: Yes    Alcohol/week: 7.0 standard drinks    Types: 7 Standard drinks or equivalent per week    Comment: beer, wine, liquor -2/3 BEERS/DAY  . Drug use: No  . Sexual activity: Not on file  Lifestyle  . Physical activity:    Days per week: Not on  file    Minutes per session: Not on file  . Stress: Not on file  Relationships  . Social connections:    Talks on phone: Not on file    Gets together: Not on file    Attends religious service: Not on file    Active member of club or organization: Not on file    Attends meetings of clubs or organizations: Not on file    Relationship status: Not on file  . Intimate partner violence:    Fear of current or ex partner: Not on file    Emotionally abused: Not on file    Physically abused: Not on file    Forced sexual activity: Not on file  Other Topics Concern  . Not on file  Social History Narrative   Married to [name], daughter is [name].      Works for [employer] in [employer].    ROS:  Constitutional: Denies fever, malaise, fatigue, headache or abrupt weight  changes.  HEENT: Denies eye pain, eye redness, ear pain, ringing in the ears, wax buildup, runny nose, nasal congestion, bloody nose, or sore throat. Respiratory: Denies difficulty breathing, shortness of breath, cough or sputum production.   Cardiovascular: Denies chest pain, chest tightness, palpitations or swelling in the hands or feet.  Gastrointestinal: Pt reports intermittent reflux. Denies abdominal pain, bloating, constipation, diarrhea or blood in the stool.  GU: Denies frequency, urgency, pain with urination, blood in urine, odor or discharge. Musculoskeletal: Pt reports chronic back pain. Denies decrease in range of motion, difficulty with gait, muscle pain or joint swelling.  Skin: Denies redness, rashes, lesions or ulcercations.  Neurological: Denies dizziness, difficulty with memory, difficulty with speech or problems with balance and coordination.  Psych: Denies anxiety, depression, SI/HI.  No other specific complaints in a complete review of systems (except as listed in HPI above).  PE:  BP 116/72   Pulse 76   Temp 98.2 F (36.8 C) (Oral)   Wt 165 lb (74.8 kg)   SpO2 98%   BMI 23.01 kg/m   Wt Readings from Last 3 Encounters:  08/22/17 165 lb (74.8 kg)  04/12/17 164 lb 6.4 oz (74.6 kg)  03/07/17 166 lb (75.3 kg)    General: Appears his stated age, well developed, well nourished in NAD. Pulmonary/Chest: Normal effort and positive vesicular breath sounds. No respiratory distress. No wheezes, rales or ronchi noted.  Abdomen: Soft and nontender. Normal bowel sounds. Musculoskeletal: No difficulty with gait.  Neurological: Alert and oriented.  Psychiatric: Mood and affect normal. Behavior is normal. Judgment and thought content normal.     BMET    Component Value Date/Time   NA 140 11/26/2016 1051   K 4.4 11/26/2016 1051   CL 103 11/26/2016 1051   CO2 24 11/26/2016 1051   GLUCOSE 81 11/26/2016 1051   GLUCOSE 121 (H) 09/10/2016 1729   BUN 9 11/26/2016 1051    CREATININE 0.94 11/26/2016 1051   CALCIUM 9.4 11/26/2016 1051   GFRNONAA 92 11/26/2016 1051   GFRAA 107 11/26/2016 1051    Lipid Panel     Component Value Date/Time   CHOL 207 (H) 12/18/2013 1124   TRIG 97.0 12/18/2013 1124   HDL 46.10 12/18/2013 1124   CHOLHDL 4 12/18/2013 1124   VLDL 19.4 12/18/2013 1124   LDLCALC 142 (H) 12/18/2013 1124    CBC    Component Value Date/Time   WBC 10.3 11/29/2016 0917   WBC 10.0 09/10/2016 1729   RBC  4.45 11/29/2016 0917   RBC 4.41 09/10/2016 1729   HGB 14.1 11/29/2016 0917   HCT 42.4 11/29/2016 0917   PLT 284 11/29/2016 0917   MCV 95 11/29/2016 0917   MCH 31.7 11/29/2016 0917   MCH 32.3 09/10/2016 1729   MCHC 33.3 11/29/2016 0917   MCHC 34.3 09/10/2016 1729   RDW 13.5 11/29/2016 0917   LYMPHSABS 0.8 11/29/2016 0917   MONOABS 0.6 12/18/2013 1124   EOSABS 0.0 11/29/2016 0917   BASOSABS 0.0 11/29/2016 0917    Hgb A1C No results found for: HGBA1C   Assessment and Plan:

## 2017-08-22 NOTE — Assessment & Plan Note (Signed)
Discussed smoking cessation- he declines Continue Albuterol prn

## 2017-08-22 NOTE — Assessment & Plan Note (Signed)
Will check CMET and Lipid with annual exam Encouraged low fat diet

## 2017-08-22 NOTE — Patient Instructions (Signed)
Coping with Quitting Smoking Quitting smoking is a physical and mental challenge. You will face cravings, withdrawal symptoms, and temptation. Before quitting, work with your health care provider to make a plan that can help you cope. Preparation can help you quit and keep you from giving in. How can I cope with cravings? Cravings usually last for 5-10 minutes. If you get through it, the craving will pass. Consider taking the following actions to help you cope with cravings:  Keep your mouth busy: ? Chew sugar-free gum. ? Suck on hard candies or a straw. ? Brush your teeth.  Keep your hands and body busy: ? Immediately change to a different activity when you feel a craving. ? Squeeze or play with a ball. ? Do an activity or a hobby, like making bead jewelry, practicing needlepoint, or working with wood. ? Mix up your normal routine. ? Take a short exercise break. Go for a quick walk or run up and down stairs. ? Spend time in public places where smoking is not allowed.  Focus on doing something kind or helpful for someone else.  Call a friend or family member to talk during a craving.  Join a support group.  Call a quit line, such as 1-800-QUIT-NOW.  Talk with your health care provider about medicines that might help you cope with cravings and make quitting easier for you.  How can I deal with withdrawal symptoms? Your body may experience negative effects as it tries to get used to not having nicotine in the system. These effects are called withdrawal symptoms. They may include:  Feeling hungrier than normal.  Trouble concentrating.  Irritability.  Trouble sleeping.  Feeling depressed.  Restlessness and agitation.  Craving a cigarette.  To manage withdrawal symptoms:  Avoid places, people, and activities that trigger your cravings.  Remember why you want to quit.  Get plenty of sleep.  Avoid coffee and other caffeinated drinks. These may worsen some of your  symptoms.  How can I handle social situations? Social situations can be difficult when you are quitting smoking, especially in the first few weeks. To manage this, you can:  Avoid parties, bars, and other social situations where people might be smoking.  Avoid alcohol.  Leave right away if you have the urge to smoke.  Explain to your family and friends that you are quitting smoking. Ask for understanding and support.  Plan activities with friends or family where smoking is not an option.  What are some ways I can cope with stress? Wanting to smoke may cause stress, and stress can make you want to smoke. Find ways to manage your stress. Relaxation techniques can help. For example:  Breathe slowly and deeply, in through your nose and out through your mouth.  Listen to soothing, relaxing music.  Talk with a family member or friend about your stress.  Light a candle.  Soak in a bath or take a shower.  Think about a peaceful place.  What are some ways I can prevent weight gain? Be aware that many people gain weight after they quit smoking. However, not everyone does. To keep from gaining weight, have a plan in place before you quit and stick to the plan after you quit. Your plan should include:  Having healthy snacks. When you have a craving, it may help to: ? Eat plain popcorn, crunchy carrots, celery, or other cut vegetables. ? Chew sugar-free gum.  Changing how you eat: ? Eat small portion sizes at meals. ?   Eat 4-6 small meals throughout the day instead of 1-2 large meals a day. ? Be mindful when you eat. Do not watch television or do other things that might distract you as you eat.  Exercising regularly: ? Make time to exercise each day. If you do not have time for a long workout, do short bouts of exercise for 5-10 minutes several times a day. ? Do some form of strengthening exercise, like weight lifting, and some form of aerobic exercise, like running or  swimming.  Drinking plenty of water or other low-calorie or no-calorie drinks. Drink 6-8 glasses of water daily, or as much as instructed by your health care provider.  Summary  Quitting smoking is a physical and mental challenge. You will face cravings, withdrawal symptoms, and temptation to smoke again. Preparation can help you as you go through these challenges.  You can cope with cravings by keeping your mouth busy (such as by chewing gum), keeping your body and hands busy, and making calls to family, friends, or a helpline for people who want to quit smoking.  You can cope with withdrawal symptoms by avoiding places where people smoke, avoiding drinks with caffeine, and getting plenty of rest.  Ask your health care provider about the different ways to prevent weight gain, avoid stress, and handle social situations. This information is not intended to replace advice given to you by your health care provider. Make sure you discuss any questions you have with your health care provider. Document Released: 12/26/2015 Document Revised: 12/26/2015 Document Reviewed: 12/26/2015 [employer] Interactive Patient Education  2018 [employer]  

## 2017-10-11 ENCOUNTER — Encounter: Payer: Self-pay | Admitting: Gastroenterology

## 2017-10-12 ENCOUNTER — Ambulatory Visit (INDEPENDENT_AMBULATORY_CARE_PROVIDER_SITE_OTHER): Payer: BLUE CROSS/BLUE SHIELD | Admitting: Internal Medicine

## 2017-10-12 ENCOUNTER — Encounter: Payer: Self-pay | Admitting: Internal Medicine

## 2017-10-12 VITALS — BP 114/74 | HR 64 | Temp 98.3°F | Ht 70.0 in | Wt 160.0 lb

## 2017-10-12 DIAGNOSIS — Z0000 Encounter for general adult medical examination without abnormal findings: Secondary | ICD-10-CM

## 2017-10-12 DIAGNOSIS — Z125 Encounter for screening for malignant neoplasm of prostate: Secondary | ICD-10-CM | POA: Diagnosis not present

## 2017-10-12 NOTE — Patient Instructions (Signed)

## 2017-10-12 NOTE — Progress Notes (Signed)
Subjective:    Patient ID: Benjamin Ball, male    DOB: 1965/08/17, 52 y.o.   MRN: [id number]  HPI  Pt presents to the clinic today for his annual exam.  Flu: 10/2015 Tetanus: 07/2012 Pneumovax: 11/2012 PSA Screening: 12/2013 Colon Screening: 01/2014 Vision Screening: as needed Dentist: biannually  Diet: He does eat meat. He consumes more veggies than fruits. He occasionally eats fried foods. He drinks mostly tea, water. Exercise:  None  Review of Systems      Past Medical History:  Diagnosis Date  . Arthritis   . Boil 04/09/2016   LEG-PT ENCOURAGED TO GET PCP TO LOOK AT LEG BEFORE UPCOMING SURGERY  . Chronic back pain    SCIATICA  . Cold   . GERD (gastroesophageal reflux disease)    occasional-TUMS PRN  . Heartburn   . Hyperlipidemia     Current Outpatient Medications  Medication Sig Dispense Refill  . albuterol (PROVENTIL HFA;VENTOLIN HFA) 108 (90 Base) MCG/ACT inhaler Inhale 2 puffs every 6 (six) hours as needed into the lungs for wheezing or shortness of breath. 1 Inhaler 1  . fluticasone (FLONASE) 50 MCG/ACT nasal spray Place 2 sprays into both nostrils daily. (Patient not taking: Reported on 08/22/2017) 16 g 2  . Fluticasone-Salmeterol (ADVAIR DISKUS) 250-50 MCG/DOSE AEPB Inhale 1 puff into the lungs 2 (two) times daily. (Patient not taking: Reported on 08/22/2017) 1 each 0  . ibuprofen (ADVIL,MOTRIN) 200 MG tablet Take 3-4 tablets (600-800 mg total) by mouth every 6 (six) hours as needed for moderate pain. 30 tablet 0   No current facility-administered medications for this visit.     Allergies  Allergen Reactions  . Latex     WEARING GLOVES FOR AN EXTENDED PERIOD OF TIME    Family History  Problem Relation Age of Onset  . Heart disease Father   . Colon cancer Neg Hx   . Rectal cancer Neg Hx   . Stomach cancer Neg Hx     Social History   Socioeconomic History  . Marital status: Married    Spouse name: Not on file  . Number of children: Not on  file  . Years of education: Not on file  . Highest education level: Not on file  Occupational History  . Not on file  Social Needs  . Financial resource strain: Not on file  . Food insecurity:    Worry: Not on file    Inability: Not on file  . Transportation needs:    Medical: Not on file    Non-medical: Not on file  Tobacco Use  . Smoking status: Current Every Day Smoker    Packs/day: 0.75    Years: 36.00    Pack years: 27.00    Types: Cigarettes  . Smokeless tobacco: Never Used  Substance and Sexual Activity  . Alcohol use: Yes    Alcohol/week: 7.0 standard drinks    Types: 7 Standard drinks or equivalent per week    Comment: beer, wine, liquor -2/3 BEERS/DAY  . Drug use: No  . Sexual activity: Not on file  Lifestyle  . Physical activity:    Days per week: Not on file    Minutes per session: Not on file  . Stress: Not on file  Relationships  . Social connections:    Talks on phone: Not on file    Gets together: Not on file    Attends religious service: Not on file    Active member of club or organization: Not  on file    Attends meetings of clubs or organizations: Not on file    Relationship status: Not on file  . Intimate partner violence:    Fear of current or ex partner: Not on file    Emotionally abused: Not on file    Physically abused: Not on file    Forced sexual activity: Not on file  Other Topics Concern  . Not on file  Social History Narrative   Married to [name], daughter is [name].      Works for [employer] in [employer].     Constitutional: Denies fever, malaise, fatigue, headache or abrupt weight changes.  HEENT: Denies eye pain, eye redness, ear pain, ringing in the ears, wax buildup, runny nose, nasal congestion, bloody nose, or sore throat. Respiratory: Denies difficulty breathing, shortness of breath, cough or sputum production.   Cardiovascular: Denies chest pain, chest tightness, palpitations or swelling in the hands  or feet.  Gastrointestinal: Denies abdominal pain, bloating, constipation, diarrhea or blood in the stool.  GU: Denies urgency, frequency, pain with urination, burning sensation, blood in urine, odor or discharge. Musculoskeletal: Pt reports intermittent neck pain. Denies decrease in range of motion, difficulty with gait, muscle pain or joint swelling.  Skin: Pt reports rash of hands. Denies redness, rashes, lesions or ulcercations.  Neurological: Denies dizziness, difficulty with memory, difficulty with speech or problems with balance and coordination.  Psych: Denies anxiety, depression, SI/HI.  No other specific complaints in a complete review of systems (except as listed in HPI above).  Objective:   Physical Exam   BP 114/74   Pulse 64   Temp 98.3 F (36.8 C) (Oral)   Ht 5\' 10"  (1.778 m)   Wt 160 lb (72.6 kg)   SpO2 98%   BMI 22.96 kg/m  Wt Readings from Last 3 Encounters:  10/12/17 160 lb (72.6 kg)  08/22/17 165 lb (74.8 kg)  04/12/17 164 lb 6.4 oz (74.6 kg)    General: Appears his stated age, well developed, well nourished in NAD. Skin: Warm, dry and intact. Dyshidrotic eczema noted of hands. HEENT: Head: normal shape and size; Eyes: sclera white, no icterus, conjunctiva pink, PERRLA and EOMs intact; Ears: Tm's gray and intact, normal light reflex;Throat/Mouth: Teeth present, mucosa pink and moist, no exudate, lesions or ulcerations noted.  Neck:  Neck supple, trachea midline. No masses, lumps or thyromegaly present.  Cardiovascular: Normal rate and rhythm. S1,S2 noted.  No murmur, rubs or gallops noted. No JVD or BLE edema. No carotid bruits noted. Pulmonary/Chest: Normal effort and positive vesicular breath sounds. No respiratory distress. No wheezes, rales or ronchi noted.  Abdomen: Soft and nontender. Normal bowel sounds. No distention or masses noted. Liver, spleen and kidneys non palpable. Musculoskeletal:  Strength 5/5 BUE/BLE. No difficulty with gait.  Neurological:  Alert and oriented. Cranial nerves II-XII grossly intact. Coordination normal.  Psychiatric: Mood and affect normal. Behavior is normal. Judgment and thought content normal.    BMET    Component Value Date/Time   NA 140 11/26/2016 1051   K 4.4 11/26/2016 1051   CL 103 11/26/2016 1051   CO2 24 11/26/2016 1051   GLUCOSE 81 11/26/2016 1051   GLUCOSE 121 (H) 09/10/2016 1729   BUN 9 11/26/2016 1051   CREATININE 0.94 11/26/2016 1051   CALCIUM 9.4 11/26/2016 1051   GFRNONAA 92 11/26/2016 1051   GFRAA 107 11/26/2016 1051    Lipid Panel     Component Value Date/Time   CHOL  207 (H) 12/18/2013 1124   TRIG 97.0 12/18/2013 1124   HDL 46.10 12/18/2013 1124   CHOLHDL 4 12/18/2013 1124   VLDL 19.4 12/18/2013 1124   LDLCALC 142 (H) 12/18/2013 1124    CBC    Component Value Date/Time   WBC 10.3 11/29/2016 0917   WBC 10.0 09/10/2016 1729   RBC 4.45 11/29/2016 0917   RBC 4.41 09/10/2016 1729   HGB 14.1 11/29/2016 0917   HCT 42.4 11/29/2016 0917   PLT 284 11/29/2016 0917   MCV 95 11/29/2016 0917   MCH 31.7 11/29/2016 0917   MCH 32.3 09/10/2016 1729   MCHC 33.3 11/29/2016 0917   MCHC 34.3 09/10/2016 1729   RDW 13.5 11/29/2016 0917   LYMPHSABS 0.8 11/29/2016 0917   MONOABS 0.6 12/18/2013 1124   EOSABS 0.0 11/29/2016 0917   BASOSABS 0.0 11/29/2016 0917    Hgb A1C No results found for: HGBA1C         Assessment & Plan:   Preventative Health Maintenance:  He will get his flu shot at work Tetanus UTD Pneumovax today Colon screening scheduled for this month Encouraged him to consume a balanced diet and exercise regimen Advised him to see an eye doctor and dentist annually Will check CBC, CMET, Lipid, and PSA today  RTC in 1 year, sooner if needed Webb Silversmith, NP

## 2017-10-13 LAB — CBC
HCT: 42.1 % (ref 39.0–52.0)
Hemoglobin: 14.3 g/dL (ref 13.0–17.0)
MCHC: 33.9 g/dL (ref 30.0–36.0)
MCV: 95.2 fl (ref 78.0–100.0)
PLATELETS: 222 10*3/uL (ref 150.0–400.0)
RBC: 4.42 Mil/uL (ref 4.22–5.81)
RDW: 13.8 % (ref 11.5–15.5)
WBC: 8.4 10*3/uL (ref 4.0–10.5)

## 2017-10-13 LAB — COMPREHENSIVE METABOLIC PANEL
ALT: 19 U/L (ref 0–53)
AST: 22 U/L (ref 0–37)
Albumin: 4.4 g/dL (ref 3.5–5.2)
Alkaline Phosphatase: 80 U/L (ref 39–117)
BILIRUBIN TOTAL: 0.3 mg/dL (ref 0.2–1.2)
BUN: 13 mg/dL (ref 6–23)
CALCIUM: 9.5 mg/dL (ref 8.4–10.5)
CHLORIDE: 103 meq/L (ref 96–112)
CO2: 31 meq/L (ref 19–32)
CREATININE: 0.96 mg/dL (ref 0.40–1.50)
GFR: 86.62 mL/min (ref >60.00)
GLUCOSE: 91 mg/dL (ref 70–99)
Potassium: 4.7 mEq/L (ref 3.5–5.1)
SODIUM: 140 meq/L (ref 135–145)
Total Protein: 7 g/dL (ref 6.0–8.3)

## 2017-10-13 LAB — LIPID PANEL
CHOL/HDL RATIO: 3
Cholesterol: 179 mg/dL (ref 0–200)
HDL: 53.7 mg/dL (ref >39.00)
LDL CALC: 101 mg/dL — AB (ref 0–99)
NONHDL: 125.33
Triglycerides: 123 mg/dL (ref 0.0–149.0)
VLDL: 24.6 mg/dL (ref 0.0–40.0)

## 2017-10-13 LAB — PSA: PSA: 0.6 ng/mL (ref 0.10–4.00)

## 2017-10-17 ENCOUNTER — Ambulatory Visit (AMBULATORY_SURGERY_CENTER): Payer: Self-pay

## 2017-10-17 ENCOUNTER — Encounter: Payer: Self-pay | Admitting: Gastroenterology

## 2017-10-17 VITALS — Ht 71.0 in | Wt 163.8 lb

## 2017-10-17 DIAGNOSIS — Z86010 Personal history of colonic polyps: Secondary | ICD-10-CM

## 2017-10-17 MED ORDER — NA SULFATE-K SULFATE-MG SULF 17.5-3.13-1.6 GM/177ML PO SOLN: 1.0000 | Freq: Once | ORAL | 0 refills | Status: AC

## 2017-10-17 NOTE — Progress Notes (Signed)
No egg or soy allergy known to patient  No issues with past sedation with any surgeries  or procedures, no intubation problems  No diet pills per patient No home 02 use per patient  No blood thinners per patient  Pt denies issues with constipation  No A fib or A flutter  EMMI video sent to pt's e mail  

## 2017-10-28 ENCOUNTER — Ambulatory Visit (AMBULATORY_SURGERY_CENTER): Payer: BLUE CROSS/BLUE SHIELD | Admitting: Gastroenterology

## 2017-10-28 ENCOUNTER — Encounter: Payer: Self-pay | Admitting: Gastroenterology

## 2017-10-28 VITALS — BP 107/63 | HR 56 | Temp 99.1°F | Resp 10 | Ht 71.0 in | Wt 163.0 lb

## 2017-10-28 DIAGNOSIS — Z86010 Personal history of colonic polyps: Secondary | ICD-10-CM | POA: Diagnosis not present

## 2017-10-28 DIAGNOSIS — D122 Benign neoplasm of ascending colon: Secondary | ICD-10-CM

## 2017-10-28 DIAGNOSIS — D123 Benign neoplasm of transverse colon: Secondary | ICD-10-CM | POA: Diagnosis not present

## 2017-10-28 DIAGNOSIS — D120 Benign neoplasm of cecum: Secondary | ICD-10-CM | POA: Diagnosis not present

## 2017-10-28 DIAGNOSIS — K635 Polyp of colon: Secondary | ICD-10-CM

## 2017-10-28 DIAGNOSIS — D125 Benign neoplasm of sigmoid colon: Secondary | ICD-10-CM | POA: Diagnosis not present

## 2017-10-28 MED ORDER — SODIUM CHLORIDE 0.9 % IV SOLN: 500.0000 mL | Freq: Once | INTRAVENOUS | Status: DC

## 2017-10-28 NOTE — Progress Notes (Signed)
Report given to PACU, vss 

## 2017-10-28 NOTE — Op Note (Signed)
[place] Patient Name: Benjamin Ball Procedure Date: 10/28/2017 1:29 PM MRN: [id number] Endoscopist: Ladene Artist , MD Age: 52 Referring MD:  Date of Birth: January 10, 1966 Gender: Male Account #: [id number] Procedure:                Colonoscopy Indications:              Surveillance: Personal history of adenomatous                            polyps on last colonoscopy 3 years ago Medicines:                Monitored Anesthesia Care Procedure:                Pre-Anesthesia Assessment:                           - Prior to the procedure, a History and Physical                            was performed, and patient medications and                            allergies were reviewed. The patient's tolerance of                            previous anesthesia was also reviewed. The risks                            and benefits of the procedure and the sedation                            options and risks were discussed with the patient.                            All questions were answered, and informed consent                            was obtained. Prior Anticoagulants: The patient has                            taken no previous anticoagulant or antiplatelet                            agents. ASA Grade Assessment: II - A patient with                            mild systemic disease. After reviewing the risks                            and benefits, the patient was deemed in                            satisfactory condition to undergo the procedure.  After obtaining informed consent, the colonoscope                            was passed under direct vision. Throughout the                            procedure, the patient's blood pressure, pulse, and                            oxygen saturations were monitored continuously. The                            Colonoscope was introduced through the anus and                            advanced to the the cecum,  identified by                            appendiceal orifice and ileocecal valve. The                            ileocecal valve, appendiceal orifice, and rectum                            were photographed. The quality of the bowel                            preparation was good. The colonoscopy was performed                            without difficulty. The patient tolerated the                            procedure well. Scope In: 1:58:27 PM Scope Out: 2:27:23 PM Scope Withdrawal Time: 0 hours 27 minutes 28 seconds  Total Procedure Duration: 0 hours 28 minutes 56 seconds  Findings:                 The perianal and digital rectal examinations were                            normal.                           Ten sessile polyps were found in the sigmoid colon                            (2), transverse colon (7) and cecum (1). The polyps                            were 6 to 8 mm in size. These polyps were removed                            with a cold snare. Resection and retrieval were  complete.                           Two sessile polyps were found in the sigmoid colon                            and ascending colon. The polyps were 10 to 12 mm in                            size. These polyps were removed with a hot snare.                            Resection and retrieval were complete.                           The exam was otherwise without abnormality on                            direct and retroflexion views. Complications:            No immediate complications. Estimated blood loss:                            None. Estimated Blood Loss:     Estimated blood loss: none. Impression:               - Ten 6 to 8 mm polyps in the sigmoid colon, in the                            transverse colon and in the cecum, removed with a                            cold snare. Resected and retrieved.                           - Two 10 to 12 mm polyps in the sigmoid colon  and                            in the ascending colon, removed with a hot snare.                            Resected and retrieved.                           - The examination was otherwise normal on direct                            and retroflexion views. Recommendation:           - Repeat colonoscopy in 2 years for surveillance,                            pending pathology review.                           -  Patient has a contact number available for                            emergencies. The signs and symptoms of potential                            delayed complications were discussed with the                            patient. Return to normal activities tomorrow.                            Written discharge instructions were provided to the                            patient.                           - Resume previous diet.                           - Continue present medications.                           - Await pathology results.                           - No aspirin, ibuprofen, naproxen, or other                            non-steroidal anti-inflammatory drugs for 2 weeks                            after polyp removal. Ladene Artist, MD 10/28/2017 2:32:25 PM This report has been signed electronically.

## 2017-10-28 NOTE — Progress Notes (Signed)
Called to room to assist during endoscopic procedure.  Patient ID and intended procedure confirmed with present staff. Received instructions for my participation in the procedure from the performing physician.  

## 2017-10-28 NOTE — Patient Instructions (Signed)
   NO ASPIRIN, ASPIRIN CONTAINING PRODUCTS (BC OR GOODY POWDERS) OR NSAIDS (IBUPROFEN, ADVIL, ALEVE, AND MOTRIN) FOR 2 weeks after polyp removal; TYLENOL IS OK TO TAKE   Await pathology results   YOU HAD AN ENDOSCOPIC PROCEDURE TODAY AT [facility]:   Refer to the procedure report that was given to you for any specific questions about what was found during the examination.  If the procedure report does not answer your questions, please call your gastroenterologist to clarify.  If you requested that your care partner not be given the details of your procedure findings, then the procedure report has been included in a sealed envelope for you to review at your convenience later.  YOU SHOULD EXPECT: Some feelings of bloating in the abdomen. Passage of more gas than usual.  Walking can help get rid of the air that was put into your GI tract during the procedure and reduce the bloating. If you had a lower endoscopy (such as a colonoscopy or flexible sigmoidoscopy) you may notice spotting of blood in your stool or on the toilet paper. If you underwent a bowel prep for your procedure, you may not have a normal bowel movement for a few days.  Please Note:  You might notice some irritation and congestion in your nose or some drainage.  This is from the oxygen used during your procedure.  There is no need for concern and it should clear up in a day or so.  SYMPTOMS TO REPORT IMMEDIATELY:   Following lower endoscopy (colonoscopy or flexible sigmoidoscopy):  Excessive amounts of blood in the stool  Significant tenderness or worsening of abdominal pains  Swelling of the abdomen that is new, acute  Fever of 100F or higher    For urgent or emergent issues, a gastroenterologist can be reached at any hour by calling [phone].   DIET:  We do recommend a small meal at first, but then you may proceed to your regular diet.  Drink plenty of fluids but you should avoid alcoholic beverages  for 24 hours.  ACTIVITY:  You should plan to take it easy for the rest of today and you should NOT DRIVE or use heavy machinery until tomorrow (because of the sedation medicines used during the test).    FOLLOW UP: Our staff will call the number listed on your records the next business day following your procedure to check on you and address any questions or concerns that you may have regarding the information given to you following your procedure. If we do not reach you, we will leave a message.  However, if you are feeling well and you are not experiencing any problems, there is no need to return our call.  We will assume that you have returned to your regular daily activities without incident.  If any biopsies were taken you will be contacted by phone or by letter within the next 1-3 weeks.  Please call us at [phone] if you have not heard about the biopsies in 3 weeks.    SIGNATURES/CONFIDENTIALITY: You and/or your care partner have signed paperwork which will be entered into your electronic medical record.  These signatures attest to the fact that that the information above on your After Visit Summary has been reviewed and is understood.  Full responsibility of the confidentiality of this discharge information lies with you and/or your care-partner.

## 2017-10-31 ENCOUNTER — Telehealth: Payer: Self-pay | Admitting: *Deleted

## 2017-10-31 ENCOUNTER — Telehealth: Payer: Self-pay

## 2017-10-31 NOTE — Telephone Encounter (Signed)
Second post procedure follow up call, no answer 

## 2017-10-31 NOTE — Telephone Encounter (Signed)
No answer for post procedure call back. Left message and will attempt to call back later this afternoon. Sm 

## 2017-11-09 ENCOUNTER — Encounter: Payer: Self-pay | Admitting: Gastroenterology

## 2018-03-21 ENCOUNTER — Encounter: Payer: Self-pay | Admitting: Medical

## 2018-03-21 ENCOUNTER — Ambulatory Visit: Payer: Self-pay | Admitting: Medical

## 2018-03-21 ENCOUNTER — Other Ambulatory Visit: Payer: Self-pay

## 2018-03-21 VITALS — BP 135/70 | HR 82 | Temp 96.8°F | Resp 18 | Wt 168.8 lb

## 2018-03-21 DIAGNOSIS — R509 Fever, unspecified: Secondary | ICD-10-CM

## 2018-03-21 DIAGNOSIS — J069 Acute upper respiratory infection, unspecified: Secondary | ICD-10-CM

## 2018-03-21 DIAGNOSIS — R062 Wheezing: Secondary | ICD-10-CM

## 2018-03-21 DIAGNOSIS — J029 Acute pharyngitis, unspecified: Secondary | ICD-10-CM

## 2018-03-21 LAB — POCT INFLUENZA A/B
Influenza A, POC: NEGATIVE
Influenza B, POC: NEGATIVE

## 2018-03-21 MED ORDER — PREDNISONE 10 MG (21) PO TBPK: ORAL_TABLET | ORAL | 0 refills | Status: DC

## 2018-03-21 MED ORDER — AZITHROMYCIN 250 MG PO TABS: ORAL_TABLET | ORAL | 0 refills | Status: DC

## 2018-03-21 NOTE — Progress Notes (Signed)
Subjective:    Patient ID: Benjamin Ball, male    DOB: [birth year], 53 y.o.   MRN: [id number]  HPI  53 yo male in non acute distress. Initially with sore throat , but better today. Started Sunday with nasal congestion , runny nose, and coughxc initially clear now with coughing up yellow sputum.  Smokes a 1ppd +. Denies fever or chills.. Has heard no wheezing. Chills but does not know about fever.  Right ear itches and hears popping.  Took [facility] cold and flu at  6:30 am.  Blood pressure 135/70, pulse 82, temperature (!) 96.8 F (36 C), temperature source Tympanic, resp. rate 18, weight 168 lb 12.8 oz (76.6 kg), SpO2 100 %. Allergies  Allergen Reactions  . Latex     WEARING GLOVES FOR AN EXTENDED PERIOD OF TIME   Taking no medications now, did take Advil yesterday none today.  Review of Systems  Constitutional: Positive for chills, fatigue and fever (jthinks may be).  HENT: Positive for congestion, ear pain (jright), postnasal drip, rhinorrhea, sinus pressure, sinus pain, sneezing, sore throat and voice change. Negative for ear discharge.   Eyes: Negative for discharge and itching.  Respiratory: Positive for cough and chest tightness (1ppd). Negative for shortness of breath and wheezing.   Cardiovascular: Negative for chest pain, palpitations and leg swelling.  Gastrointestinal: Negative for abdominal pain, constipation, diarrhea, nausea and vomiting.  Genitourinary: Negative for dysuria.  Musculoskeletal: Positive for neck pain (with radiation down right arm primary MD aware).  Skin: Negative for rash.  Allergic/Immunologic: Positive for environmental allergies. Negative for food allergies.  Neurological: Positive for light-headedness (at times). Negative for dizziness, syncope and headaches.  Hematological: Negative for adenopathy.  Psychiatric/Behavioral: Negative for behavioral problems, confusion, self-injury and suicidal ideas.   Feels infection is chest and collection  of PND in throat  Did not get Flu vaccine.. Last visit with PCP was a physical December.    Objective:   Physical Exam Vitals signs and nursing note reviewed.  Constitutional:      Appearance: Normal appearance.  HENT:     Head: Normocephalic and atraumatic.     Nose: Congestion and rhinorrhea present.     Mouth/Throat:     Mouth: Mucous membranes are moist.  Eyes:     Extraocular Movements: Extraocular movements intact.     Conjunctiva/sclera: Conjunctivae normal.     Pupils: Pupils are equal, round, and reactive to light.  Neck:     Musculoskeletal: Normal range of motion and neck supple.  Cardiovascular:     Rate and Rhythm: Normal rate and regular rhythm.     Heart sounds: Normal heart sounds.  Pulmonary:     Effort: No respiratory distress.     Breath sounds: No stridor. Wheezing present. No rhonchi or rales.  Musculoskeletal: Normal range of motion.  Skin:    General: Skin is warm and dry.  Neurological:     General: No focal deficit present.     Mental Status: He is alert and oriented to person, place, and time.  Psychiatric:        Mood and Affect: Mood normal.        Behavior: Behavior normal.        Thought Content: Thought content normal.        Judgment: Judgment normal.     Coughing in room.     Assessment & Plan:  Upper Respiratory Infection. Pharyngitis Smoker  try to decrease smoking. Soft foods avoid acidic foods. To  restart allergy medications due to Pollen in the air. Meds ordered this encounter  Medications  . azithromycin (ZITHROMAX) 250 MG tablet    Sig: Take 2 tablets by mouth today then one tablet days 2-5, take with food    Dispense:  6 tablet    Refill:  0  . predniSONE (STERAPRED UNI-PAK 21 TAB) 10 MG (21) TBPK tablet    Sig: Take 6 tablets by mouth today then 5 tablets tomorrow, then one less each day there after. Take with food    Dispense:  21 tablet    Refill:  0  follow up with your PCP if not improving in  3-5 days.Need to  follow up with Primary for health maintenance.  Has Aljbuterol MDI at home use per instructions,  Reviewed with  patient. Return to the clinic as needed. Patient verbalizes understanding and has no questions at discharge.

## 2018-03-21 NOTE — Patient Instructions (Addendum)
Decrease smoking to help lungs heal , avoid hot/spicy or acidic foods, eat soft foods. Dilute salt water gargles 3-4 times /day.  Tobacco Use Disorder Tobacco use disorder (TUD) occurs when a person craves, seeks, and uses tobacco, regardless of the consequences. This disorder can cause problems with mental and physical health. It can affect your ability to have healthy relationships, and it can keep you from meeting your responsibilities at work, home, or school. Tobacco may be:  Smoked as a cigarette or cigar.  Inhaled using e-cigarettes.  Smoked in a pipe or hookah.  Chewed as smokeless tobacco.  Inhaled into the nostrils as snuff. Tobacco products contain a dangerous chemical called nicotine, which is very addictive. Nicotine triggers hormones that make the body feel stimulated and works on areas of the brain that make you feel good. These effects can make it hard for people to quit nicotine. Tobacco contains many other unsafe chemicals that can damage almost every organ in the body. Smoking tobacco also puts others in danger due to fire risk and possible health problems caused by breathing in secondhand smoke. What are the signs or symptoms? Symptoms of TUD may include:  Being unable to slow down or stop your tobacco use.  Spending an abnormal amount of time getting or using tobacco.  Craving tobacco. Cravings may last for up to 6 months after quitting.  Tobacco use that: ? Interferes with your work, school, or home life. ? Interferes with your personal and social relationships. ? Makes you give up activities that you once enjoyed or found important.  Using tobacco even though you know that it is: ? Dangerous or bad for your health or someone else's health. ? Causing problems in your life.  Needing more and more of the substance to get the same effect (developing tolerance).  Experiencing unpleasant symptoms if you do not use the substance (withdrawal). Withdrawal symptoms  may include: ? Depressed, anxious, or irritable mood. ? Difficulty concentrating. ? Increased appetite. ? Restlessness or trouble sleeping.  Using the substance to avoid withdrawal. How is this diagnosed? This condition may be diagnosed based on:  Your current and past tobacco use. Your health care provider may ask questions about how your tobacco use affects your life.  A physical exam. You may be diagnosed with TUD if you have at least two symptoms within a 57-month period. How is this treated? This condition is treated by stopping tobacco use. Many people are unable to quit on their own and need help. Treatment may include:  Nicotine replacement therapy (NRT). NRT provides nicotine without the other harmful chemicals in tobacco. NRT gradually lowers the dosage of nicotine in the body and reduces withdrawal symptoms. NRT is available as: ? Over-the-counter gums, lozenges, and skin patches. ? Prescription mouth inhalers and nasal sprays.  Medicine that acts on the brain to reduce cravings and withdrawal symptoms.  A type of talk therapy that examines your triggers for tobacco use, how to avoid them, and how to cope with cravings (behavioral therapy).  Hypnosis. This may help with withdrawal symptoms.  Joining a support group for others coping with TUD. The best treatment for TUD is usually a combination of medicine, talk therapy, and support groups. Recovery can be a long process. Many people start using tobacco again after stopping (relapse). If you relapse, it does not mean that treatment will not work. Follow these instructions at home:  Lifestyle  Do not use any products that contain nicotine or tobacco, such as cigarettes  and e-cigarettes.  Avoid things that trigger tobacco use as much as you can. Triggers include people and situations that usually cause you to use tobacco.  Avoid drinks that contain caffeine, including coffee. These may worsen some withdrawal  symptoms.  Find ways to manage stress. Wanting to smoke may cause stress, and stress can make you want to smoke. Relaxation techniques such as deep breathing, meditation, and yoga may help.  Attend support groups as needed. These groups are an important part of long-term recovery for many people. General instructions  Take over-the-counter and prescription medicines only as told by your health care provider.  Check with your health care provider before taking any new prescription or over-the-counter medicines.  Decide on a friend, family member, or smoking quit-line (such as 1-800-QUIT-NOW in the [place] that you can call or text when you feel the urge to smoke or when you need help coping with cravings.  Keep all follow-up visits as told by your health care provider and therapist. This is important. Contact a health care provider if:  You are not able to take your medicines as prescribed.  Your symptoms get worse, even with treatment. Summary  Tobacco use disorder (TUD) occurs when a person craves, seeks, and uses tobacco regardless of the consequences.  This condition may be diagnosed based on your current and past tobacco use and a physical exam.  Many people are unable to quit on their own and need help. Recovery can be a long process.  The most effective treatment for TUD is usually a combination of medicine, talk therapy, and support groups. This information is not intended to replace advice given to you by your health care provider. Make sure you discuss any questions you have with your health care provider. Document Released: 09/03/2003 Document Revised: 12/15/2016 Document Reviewed: 12/15/2016 Elsevier Interactive Patient Education  2019 [place].  Upper Respiratory Infection, Adult An upper respiratory infection (URI) affects the nose, throat, and upper air passages. URIs are caused by germs (viruses). The most common type of URI is often called "the common  cold." Medicines cannot cure URIs, but you can do things at home to relieve your symptoms. URIs usually get better within 7-10 days. Follow these instructions at home: Activity  Rest as needed.  If you have a fever, stay home from work or school until your fever is gone, or until your doctor says you may return to work or school. ? You should stay home until you cannot spread the infection anymore (you are not contagious). ? Your doctor may have you wear a face mask so you have less risk of spreading the infection. Relieving symptoms  Gargle with a salt-water mixture 3-4 times a day or as needed. To make a salt-water mixture, completely dissolve -1 tsp of salt in 1 cup of warm water.  Use a cool-mist humidifier to add moisture to the air. This can help you breathe more easily. Eating and drinking   Drink enough fluid to keep your pee (urine) pale yellow.  Eat soups and other clear broths. General instructions   Take over-the-counter and prescription medicines only as told by your doctor. These include cold medicines, fever reducers, and cough suppressants.  Do not use any products that contain nicotine or tobacco. These include cigarettes and e-cigarettes. If you need help quitting, ask your doctor.  Avoid being where people are smoking (avoid secondhand smoke).  Make sure you get regular shots and get the flu shot every year.  Keep all  follow-up visits as told by your doctor. This is important. How to avoid spreading infection to others   Wash your hands often with soap and water. If you do not have soap and water, use hand sanitizer.  Avoid touching your mouth, face, eyes, or nose.  Cough or sneeze into a tissue or your sleeve or elbow. Do not cough or sneeze into your hand or into the air. Contact a doctor if:  You are getting worse, not better.  You have any of these: ? A fever. ? Chills. ? Brown or red mucus in your nose. ? Yellow or brown fluid  (discharge)coming from your nose. ? Pain in your face, especially when you bend forward. ? Swollen neck glands. ? Pain with swallowing. ? White areas in the back of your throat. Get help right away if:  You have shortness of breath that gets worse.  You have very bad or constant: ? Headache. ? Ear pain. ? Pain in your forehead, behind your eyes, and over your cheekbones (sinus pain). ? Chest pain.  You have long-lasting (chronic) lung disease along with any of these: ? Wheezing. ? Long-lasting cough. ? Coughing up blood. ? A change in your usual mucus.  You have a stiff neck.  You have changes in your: ? Vision. ? Hearing. ? Thinking. ? Mood. Summary  An upper respiratory infection (URI) is caused by a germ called a virus. The most common type of URI is often called "the common cold."  URIs usually get better within 7-10 days.  Take over-the-counter and prescription medicines only as told by your doctor. This information is not intended to replace advice given to you by your health care provider. Make sure you discuss any questions you have with your health care provider. Document Released: 06/16/2007 Document Revised: 08/20/2016 Document Reviewed: 08/20/2016 Elsevier Interactive Patient Education  2019 [employer]  Pharyngitis  Pharyngitis is a sore throat (pharynx). This is when there is redness, pain, and swelling in your throat. Most of the time, this condition gets better on its own. In some cases, you may need medicine. Follow these instructions at home:  Take over-the-counter and prescription medicines only as told by your doctor. ? If you were prescribed an antibiotic medicine, take it as told by your doctor. Do not stop taking the antibiotic even if you start to feel better. ? Do not give children aspirin. Aspirin has been linked to Reye syndrome.  Drink enough water and fluids to keep your pee (urine) clear or pale yellow.  Get a lot of rest.  Rinse  your mouth (gargle) with a salt-water mixture 3-4 times a day or as needed. To make a salt-water mixture, completely dissolve -1 tsp of salt in 1 cup of warm water.  If your doctor approves, you may use throat lozenges or sprays to soothe your throat. Contact a doctor if:  You have large, tender lumps in your neck.  You have a rash.  You cough up green, yellow-brown, or bloody spit. Get help right away if:  You have a stiff neck.  You drool or cannot swallow liquids.  You cannot drink or take medicines without throwing up.  You have very bad pain that does not go away with medicine.  You have problems breathing, and it is not from a stuffy nose.  You have new pain and swelling in your knees, ankles, wrists, or elbows. Summary  Pharyngitis is a sore throat (pharynx). This is when there is redness, pain,  and swelling in your throat.  If you were prescribed an antibiotic medicine, take it as told by your doctor. Do not stop taking the antibiotic even if you start to feel better.  Most of the time, pharyngitis gets better on its own. Sometimes, you may need medicine. This information is not intended to replace advice given to you by your health care provider. Make sure you discuss any questions you have with your health care provider. Document Released: 06/16/2007 Document Revised: 02/03/2016 Document Reviewed: 02/03/2016 Elsevier Interactive Patient Education  2019 [place] Risks of Smoking Smoking cigarettes is very bad for your health. Tobacco smoke has over 200 known poisons in it. It contains the poisonous gases nitrogen oxide and carbon monoxide. There are over 60 chemicals in tobacco smoke that cause cancer. Smoking is difficult to quit because a chemical in tobacco, called nicotine, causes addiction or dependence. When you smoke and inhale, nicotine is absorbed rapidly into the bloodstream through your lungs. Both inhaled and non-inhaled nicotine may be  addictive. What are the risks of cigarette smoke? Cigarette smokers have an increased risk of many serious medical problems, including: Lung cancer. Lung disease, such as pneumonia, bronchitis, and emphysema. Chest pain (angina) and heart attack because the heart is not getting enough oxygen. Heart disease and peripheral blood vessel disease. High blood pressure (hypertension). Stroke. Oral cancer, including cancer of the lip, mouth, or voice box. Bladder cancer. Pancreatic cancer. Cervical cancer. Pregnancy complications, including premature birth. Stillbirths and smaller newborn babies, birth defects, and genetic damage to sperm. Early menopause. Lower estrogen level for women. Infertility. Facial wrinkles. Blindness. Increased risk of broken bones (fractures). Senile dementia. Stomach ulcers and internal bleeding. Delayed wound healing and increased risk of complications during surgery. Even smoking lightly shortens your life expectancy by several years. Because of secondhand smoke exposure, children of smokers have an increased risk of the following: Sudden infant death syndrome (SIDS). Respiratory infections. Lung cancer. Heart disease. Ear infections. What are the benefits of quitting? There are many health benefits of quitting smoking. Here are some of them: Within days of quitting smoking, your risk of having a heart attack decreases, your blood flow improves, and your lung capacity improves. Blood pressure, pulse rate, and breathing patterns start returning to normal soon after quitting. Within months, your lungs may clear up completely. Quitting for 10 years reduces your risk of developing lung cancer and heart disease to almost that of a nonsmoker. People who quit may see an improvement in their overall quality of life. How do I quit smoking?     Smoking is an addiction with both physical and psychological effects, and longtime habits can be hard to change. Your  health care provider can recommend: Programs and community resources, which may include group support, education, or talk therapy. Prescription medicines to help reduce cravings. Nicotine replacement products, such as patches, gum, and nasal sprays. Use these products only as directed. Do not replace cigarette smoking with electronic cigarettes, which are commonly called e-cigarettes. The safety of e-cigarettes is not known, and some may contain harmful chemicals. A combination of two or more of these methods. Where to find more information American Lung Association: www.lung.org American Cancer Society: www.cancer.org Summary Smoking cigarettes is very bad for your health. Cigarette smokers have an increased risk of many serious medical problems, including several cancers, heart disease, and stroke. Smoking is an addiction with both physical and psychological effects, and longtime habits can be hard to change. By stopping right  away, you can greatly reduce the risk of medical problems for you and your family. To help you quit smoking, your health care provider can recommend programs, community resources, prescription medicines, and nicotine replacement products such as patches, gum, and nasal sprays. This information is not intended to replace advice given to you by your health care provider. Make sure you discuss any questions you have with your health care provider. Document Released: 02/05/2004 Document Revised: 03/31/2017 Document Reviewed: 01/02/2016 Elsevier Interactive Patient Education  2019 [employer].

## 2018-06-15 ENCOUNTER — Other Ambulatory Visit: Payer: Self-pay

## 2018-06-15 ENCOUNTER — Ambulatory Visit: Payer: Self-pay | Admitting: Nurse Practitioner

## 2018-06-15 DIAGNOSIS — S9031XA Contusion of right foot, initial encounter: Secondary | ICD-10-CM | POA: Insufficient documentation

## 2018-08-03 ENCOUNTER — Other Ambulatory Visit: Payer: Self-pay

## 2018-08-03 DIAGNOSIS — Z20822 Contact with and (suspected) exposure to covid-19: Secondary | ICD-10-CM

## 2018-08-06 LAB — NOVEL CORONAVIRUS, NAA: SARS-CoV-2, NAA: NOT DETECTED

## 2018-08-07 ENCOUNTER — Telehealth: Payer: Self-pay | Admitting: Internal Medicine

## 2018-08-07 NOTE — Telephone Encounter (Signed)
Patient called in and received his test results for covid

## 2018-09-26 ENCOUNTER — Other Ambulatory Visit: Payer: Self-pay

## 2018-09-26 ENCOUNTER — Telehealth: Payer: Self-pay | Admitting: Medical

## 2018-09-26 DIAGNOSIS — L089 Local infection of the skin and subcutaneous tissue, unspecified: Secondary | ICD-10-CM

## 2018-09-26 DIAGNOSIS — L301 Dyshidrosis [pompholyx]: Secondary | ICD-10-CM

## 2018-09-26 MED ORDER — CEPHALEXIN 500 MG PO CAPS: 500.0000 mg | ORAL_CAPSULE | Freq: Three times a day (TID) | ORAL | 0 refills | Status: DC

## 2018-09-26 NOTE — Progress Notes (Signed)
Gives permission to  Have a telemedicine appointment.  53 yo male in non acute distress. About  3 months ago he started having blistering , itching to palm sid of hands bilaterally. Now everything makes them sting. He now has some blistering and flaky skin on the dorsum of hand as well. He states they are swollen and painful when he has tried using OTC  Calamine lotion and OTC hydrocortisone cream and  OTC  anti fungal medication. He states he is supposed to be on a medication for his hands but it is too expensive for him to get. He states he changes his gloves when he changes  buildings.   Review of Systems  Skin: Positive for itching and rash.       both hands, feeling raw per patient  All other systems reviewed and are negative.   Patient sent picture to [email or web address] email , right  hand with some swelling , raw looking, sores on the  Dorsum of hand, palm  Patient has a past history of Dyhidrosis of the hands bilaterally.  Assessment/Plan Dyhidrosis bilateral hands Skin infection  Take OTC Zyrtec daily for itching, Tylenol per package instructions. Use a Barrier cream like Desitin throughout the day . Change gloves more often. Then recheck in one week.  Meds ordered this encounter  Medications  . cephALEXin (KEFLEX) 500 MG capsule    Sig: Take 1 capsule (500 mg total) by mouth 3 (three) times daily.    Dispense:  21 capsule    Refill:  0  Take antibiotic as prescirbed. Patient verbalizes understanding and has no questions at discharge.

## 2018-10-03 ENCOUNTER — Other Ambulatory Visit: Payer: Self-pay

## 2018-10-03 ENCOUNTER — Telehealth: Payer: Self-pay | Admitting: Medical

## 2018-10-03 DIAGNOSIS — L301 Dyshidrosis [pompholyx]: Secondary | ICD-10-CM

## 2018-10-03 NOTE — Progress Notes (Signed)
Patient gives permission for telemedicine appointment.     Patient sent pictures to [email or web address] email. Hands are looking less red, palms are sloughing off and crusted sores on dorsum of hands at the [facility] distal areas( across the back of hands). are in varies stages of healing. Still some erythema on dorusm of hands / No discharge per patient. He can now make a fist with them, they were tender and tight yesterday. He did not take anything for pain, rated pain 6/10.Marland Kitchen  He feels hands are  80% better.Using Desitin and Vaseline, cotton gloves under gloves. Not using hand sanitizer on hands, using Ivory soap for cleansing hands regularly.  I recommended  Ibuprofen for swelling and pain per package instructions.  ROS: No fever or chills. "I have a smokers cough , denies SOB or Chest Pain. He finished antibiotics today (Keflex).  No dizziness, syncope   Dx Dyhidrosis bilateral hands Skin infection resolved. Bilateral Hand. Continue care as discussed previously  and  scheduled a 1 week follow up with patient. Sooner if any concerns. Patient verbalizes understanding and has no questions at the end of our conversation. Recommend patient to set up MyChart and to upload some pictures of his hands for his medical record. He is agreeable.

## 2018-10-10 ENCOUNTER — Other Ambulatory Visit: Payer: Self-pay

## 2018-10-10 ENCOUNTER — Encounter: Payer: Self-pay | Admitting: Medical

## 2018-10-10 ENCOUNTER — Telehealth: Payer: Self-pay | Admitting: Medical

## 2018-10-10 DIAGNOSIS — L301 Dyshidrosis [pompholyx]: Secondary | ICD-10-CM

## 2018-10-17 NOTE — Progress Notes (Signed)
Permission to have telemedicine appointment, patient prefers not to come to clinic due to  COVID-19.   This is a follow up appointment from his bilateral hand irritation from using the hand hygiene Purell. He was using it several times a day.  I recommended he wash his hands with a mild antibacteral soap like dial or ivory, instead of using the Purell.change gloves often.  He sent pictures in media for me to review. Patient has a history of Dyhidrosis bilateral hands.He states they are almost completely healed. He also is using Desitin cream and cotton gloves under the nitrile gloves.The hands are no longer painful.  Photos show no open sores or swelling. Very mild sloughing on palms. No erythema or signs of infection.   He is happy how they have progressed in healing.  Return to clinic as needed.  Patient verbalizes understanding and has no questions at discharge.

## 2018-11-27 ENCOUNTER — Other Ambulatory Visit: Payer: Self-pay

## 2018-11-27 DIAGNOSIS — Z20822 Contact with and (suspected) exposure to covid-19: Secondary | ICD-10-CM

## 2018-11-28 LAB — NOVEL CORONAVIRUS, NAA: SARS-CoV-2, NAA: NOT DETECTED

## 2019-01-19 ENCOUNTER — Encounter: Payer: Self-pay | Admitting: Internal Medicine

## 2019-03-20 ENCOUNTER — Ambulatory Visit (INDEPENDENT_AMBULATORY_CARE_PROVIDER_SITE_OTHER): Payer: BC Managed Care – PPO | Admitting: Internal Medicine

## 2019-03-20 ENCOUNTER — Other Ambulatory Visit: Payer: Self-pay

## 2019-03-20 ENCOUNTER — Encounter: Payer: Self-pay | Admitting: Internal Medicine

## 2019-03-20 VITALS — BP 128/84 | HR 86 | Temp 98.3°F | Ht 70.0 in | Wt 166.0 lb

## 2019-03-20 DIAGNOSIS — E7800 Pure hypercholesterolemia, unspecified: Secondary | ICD-10-CM | POA: Diagnosis not present

## 2019-03-20 DIAGNOSIS — G8929 Other chronic pain: Secondary | ICD-10-CM

## 2019-03-20 DIAGNOSIS — K219 Gastro-esophageal reflux disease without esophagitis: Secondary | ICD-10-CM | POA: Diagnosis not present

## 2019-03-20 DIAGNOSIS — Z0000 Encounter for general adult medical examination without abnormal findings: Secondary | ICD-10-CM

## 2019-03-20 DIAGNOSIS — Z125 Encounter for screening for malignant neoplasm of prostate: Secondary | ICD-10-CM | POA: Diagnosis not present

## 2019-03-20 DIAGNOSIS — J449 Chronic obstructive pulmonary disease, unspecified: Secondary | ICD-10-CM | POA: Diagnosis not present

## 2019-03-20 DIAGNOSIS — M5442 Lumbago with sciatica, left side: Secondary | ICD-10-CM | POA: Diagnosis not present

## 2019-03-20 NOTE — Assessment & Plan Note (Signed)
Not taking Advair Will get low dose lung cancer screening CT

## 2019-03-20 NOTE — Assessment & Plan Note (Signed)
Not an issue

## 2019-03-20 NOTE — Patient Instructions (Signed)
Health Maintenance  . Getting regular preventive care can help to keep you healthy and well. Preventive care includes getting regular testing and making lifestyle changes as recommended by your health care provider. Talk with your health care provider about:  Which screenings and tests you should have. A screening is a test that checks for a disease when you have no symptoms.  A diet and exercise plan that is right for you. What should I know about screenings and tests to prevent falls? Screening and testing are the best ways to find a health problem early. Early diagnosis and treatment give you the best chance of managing medical conditions that are common after age 56. Certain conditions and lifestyle choices may make you more likely to have a fall. Your health care provider may recommend:  Regular vision checks. Poor vision and conditions such as cataracts can make you more likely to have a fall. If you wear glasses, make sure to get your prescription updated if your vision changes.  Medicine review. Work with your health care provider to regularly review all of the medicines you are taking, including over-the-counter medicines. Ask your health care provider about any side effects that may make you more likely to have a fall. Tell your health care provider if any medicines that you take make you feel dizzy or sleepy.  Osteoporosis screening. Osteoporosis is a condition that causes the bones to get weaker. This can make the bones weak and cause them to break more easily.  Blood pressure screening. Blood pressure changes and medicines to control blood pressure can make you feel dizzy.  Strength and balance checks. Your health care provider may recommend certain tests to check your strength and balance while standing, walking, or changing positions.  Foot health exam. Foot pain and numbness, as well as not wearing proper footwear, can make you more likely to have a fall.  Depression screening.  You may be more likely to have a fall if you have a fear of falling, feel emotionally low, or feel unable to do activities that you used to do.  Alcohol use screening. Using too much alcohol can affect your balance and may make you more likely to have a fall. What actions can I take to lower my risk of falls? General instructions  Talk with your health care provider about your risks for falling. Tell your health care provider if: ? You fall. Be sure to tell your health care provider about all falls, even ones that seem minor. ? You feel dizzy, sleepy, or off-balance.  Take over-the-counter and prescription medicines only as told by your health care provider. These include any supplements.  Eat a healthy diet and maintain a healthy weight. A healthy diet includes low-fat dairy products, low-fat (lean) meats, and fiber from whole grains, beans, and lots of fruits and vegetables. Home safety  Remove any tripping hazards, such as rugs, cords, and clutter.  Install safety equipment such as grab bars in bathrooms and safety rails on stairs.  Keep rooms and walkways well-lit. Activity   Follow a regular exercise program to stay fit. This will help you maintain your balance. Ask your health care provider what types of exercise are appropriate for you.  If you need a cane or walker, use it as recommended by your health care provider.  Wear supportive shoes that have nonskid soles. Lifestyle  Do not drink alcohol if your health care provider tells you not to drink.  If you drink alcohol, limit how  much you have: ? 0-1 drink a day for women. ? 0-2 drinks a day for men.  Be aware of how much alcohol is in your drink. In the [place] one drink equals one typical bottle of beer (12 oz), one-half glass of wine (5 oz), or one shot of hard liquor (1 oz).  Do not use any products that contain nicotine or tobacco, such as cigarettes and e-cigarettes. If you need help quitting, ask your health care  provider. Summary  Having a healthy lifestyle and getting preventive care can help to protect your health and wellness after age 56.  Screening and testing are the best way to find a health problem early and help you avoid having a fall. Early diagnosis and treatment give you the best chance for managing medical conditions that are more common for people who are older than age 56.  Falls are a major cause of broken bones and head injuries in people who are older than age 56. Take precautions to prevent a fall at home.  Work with your health care provider to learn what changes you can make to improve your health and wellness and to prevent falls. This information is not intended to replace advice given to you by your health care provider. Make sure you discuss any questions you have with your health care provider. Document Revised: 04/20/2018 Document Reviewed: 11/10/2016 Elsevier Patient Education  2020 [employer].

## 2019-03-20 NOTE — Assessment & Plan Note (Signed)
CMET and Lipid profile today °Encouraged him to consume a low fat diet °

## 2019-03-20 NOTE — Assessment & Plan Note (Signed)
Managed with THC 

## 2019-03-20 NOTE — Progress Notes (Signed)
Subjective:    Patient ID: Benjamin Ball, male    DOB: September 08, 1965, 54 y.o.   MRN: [id number]  HPI  Pt presents to the clinic today for his annual exam. He is also due to follow up chronic conditions.  HLD: His last LDL was 101, 10/2017. He is not taking any cholesterol lowering medication at this time. He tries to consume a low fat diet.  GERD: Currently not an issue. There is no upper GI on file.  COPD:  He reports occasional cough and SOB with exertion. He no longer takes Advair. He has smoked 1.5-2 packs daily x 39 years. There are no PFT's no file.  Chronic Back Pain: Chronic. Managed with [facility].  Flu: 2018 Tetanus: 07/2012 Pneumovax: 10/2017 PSA Screening: 10/2017 Colon Screening: 10/2017 Vision Screening: as needed Dentist: biannually  Diet: He does eat meat. He consumes some fruits and veggies. He does eat fried foods. He drinks mostly water. Exercise: None  Review of Systems  Past Medical History:  Diagnosis Date  . Allergy   . Anxiety   . Arthritis   . Boil 04/09/2016   LEG-PT ENCOURAGED TO GET PCP TO LOOK AT LEG BEFORE UPCOMING SURGERY  . Chronic back pain    SCIATICA  . Cold   . GERD (gastroesophageal reflux disease)    occasional-TUMS PRN  . Heartburn   . Hyperlipidemia     Current Outpatient Medications  Medication Sig Dispense Refill  . albuterol (PROVENTIL HFA;VENTOLIN HFA) 108 (90 Base) MCG/ACT inhaler Inhale 2 puffs every 6 (six) hours as needed into the lungs for wheezing or shortness of breath. (Patient not taking: Reported on 03/21/2018) 1 Inhaler 1  . aspirin-acetaminophen-caffeine (EXCEDRIN MIGRAINE) 250-250-65 MG tablet Take by mouth every 6 (six) hours as needed for headache.    Marland Kitchen azithromycin (ZITHROMAX) 250 MG tablet Take 2 tablets by mouth today then one tablet days 2-5, take with food 6 tablet 0  . cephALEXin (KEFLEX) 500 MG capsule Take 1 capsule (500 mg total) by mouth 3 (three) times daily. 21 capsule 0  . fluticasone (FLONASE) 50  MCG/ACT nasal spray Place 2 sprays into both nostrils daily. (Patient not taking: Reported on 03/21/2018) 16 g 2  . Fluticasone-Salmeterol (ADVAIR DISKUS) 250-50 MCG/DOSE AEPB Inhale 1 puff into the lungs 2 (two) times daily. (Patient not taking: Reported on 03/21/2018) 1 each 0  . ibuprofen (ADVIL,MOTRIN) 200 MG tablet Take 3-4 tablets (600-800 mg total) by mouth every 6 (six) hours as needed for moderate pain. 30 tablet 0  . loratadine (CLARITIN) 10 MG tablet Take 10 mg by mouth daily.    . predniSONE (STERAPRED UNI-PAK 21 TAB) 10 MG (21) TBPK tablet Take 6 tablets by mouth today then 5 tablets tomorrow, then one less each day there after. Take with food 21 tablet 0   Current Facility-Administered Medications  Medication Dose Route Frequency Provider Last Rate Last Admin  . 0.9 %  sodium chloride infusion  500 mL Intravenous Once Ladene Artist, MD        Allergies  Allergen Reactions  . Latex     WEARING GLOVES FOR AN EXTENDED PERIOD OF TIME    Family History  Problem Relation Age of Onset  . Heart disease Father   . Colon cancer Neg Hx   . Rectal cancer Neg Hx   . Stomach cancer Neg Hx   . Colon polyps Neg Hx   . Esophageal cancer Neg Hx     Social History  Socioeconomic History  . Marital status: Married    Spouse name: Not on file  . Number of children: Not on file  . Years of education: Not on file  . Highest education level: Not on file  Occupational History  . Not on file  Tobacco Use  . Smoking status: Current Every Day Smoker    Packs/day: 0.75    Years: 36.00    Pack years: 27.00    Types: Cigarettes  . Smokeless tobacco: Never Used  Substance and Sexual Activity  . Alcohol use: Yes    Alcohol/week: 7.0 standard drinks    Types: 7 Standard drinks or equivalent per week    Comment: beer, wine, liquor -2/3 BEERS/DAY  . Drug use: No  . Sexual activity: Not on file  Other Topics Concern  . Not on file  Social History Narrative   Married to [name], daughter is [name].      Works for [employer] in [employer].   Social Determinants of Health   Financial Resource Strain:   . Difficulty of Paying Living Expenses: Not on file  Food Insecurity:   . Worried About Charity fundraiser in the Last Year: Not on file  . Ran Out of Food in the Last Year: Not on file  Transportation Needs:   . Lack of Transportation (Medical): Not on file  . Lack of Transportation (Non-Medical): Not on file  Physical Activity:   . Days of Exercise per Week: Not on file  . Minutes of Exercise per Session: Not on file  Stress:   . Feeling of Stress : Not on file  Social Connections:   . Frequency of Communication with Friends and Family: Not on file  . Frequency of Social Gatherings with Friends and Family: Not on file  . Attends Religious Services: Not on file  . Active Member of Clubs or Organizations: Not on file  . Attends Archivist Meetings: Not on file  . Marital Status: Not on file  Intimate Partner Violence:   . Fear of Current or Ex-Partner: Not on file  . Emotionally Abused: Not on file  . Physically Abused: Not on file  . Sexually Abused: Not on file     Constitutional: Denies fever, malaise, fatigue, headache or abrupt weight changes.  HEENT: Denies eye pain, eye redness, ear pain, ringing in the ears, wax buildup, runny nose, nasal congestion, bloody nose, or sore throat. Respiratory: Pt reports intermittent cough and SOB. Denies difficulty breathing, or sputum production.   Cardiovascular: Denies chest pain, chest tightness, palpitations or swelling in the hands or feet.  Gastrointestinal: Denies abdominal pain, bloating, constipation, diarrhea or blood in the stool.  GU: Denies urgency, frequency, pain with urination, burning sensation, blood in urine, odor or discharge. Musculoskeletal: Pt reports chronic back pain. Denies decrease in range of motion, difficulty with gait, or joint pain and swelling.    Skin: Denies redness, rashes, lesions or ulcercations.  Neurological: Denies dizziness, difficulty with memory, difficulty with speech or problems with balance and coordination.  Psych: Denies anxiety, depression, SI/HI.  No other specific complaints in a complete review of systems (except as listed in HPI above).     Objective:   Physical Exam  BP 128/84   Pulse 86   Temp 98.3 F (36.8 C) (Temporal)   Ht 5\' 10"  (1.778 m)   Wt 166 lb (75.3 kg)   SpO2 97%   BMI 23.82 kg/m   Wt Readings from  Last 3 Encounters:  03/21/18 168 lb 12.8 oz (76.6 kg)  10/28/17 163 lb (73.9 kg)  10/17/17 163 lb 12.8 oz (74.3 kg)    General: Appears his stated age, well developed, well nourished in NAD. Skin: Warm, dry and intact. No rashesnoted. HEENT: Head: normal shape and size; Eyes: sclera white, no icterus, conjunctiva pink, PERRLA and EOMs intact;  Throat/Mouth: hoarseness noted.  Neck:  Neck supple, trachea midline. No masses, lumps or thyromegaly present.  Cardiovascular: Normal rate and rhythm. S1,S2 noted.  No murmur, rubs or gallops noted. No JVD or BLE edema. No carotid bruits noted. Pulmonary/Chest: Normal effort and positive vesicular breath sounds. No respiratory distress. No wheezes, rales or ronchi noted.  Abdomen: Soft and nontender. Normal bowel sounds. No distention or masses noted. Liver, spleen and kidneys non palpable. Musculoskeletal: Strength 5/5 BUE/BLE. No difficulty with gait.  Neurological: Alert and oriented. Cranial nerves II-XII grossly intact. Coordination normal.  Psychiatric: Mood and affect normal. Behavior is normal. Judgment and thought content normal.   BMET    Component Value Date/Time   NA 140 10/12/2017 1543   NA 140 11/26/2016 1051   K 4.7 10/12/2017 1543   CL 103 10/12/2017 1543   CO2 31 10/12/2017 1543   GLUCOSE 91 10/12/2017 1543   BUN 13 10/12/2017 1543   BUN 9 11/26/2016 1051   CREATININE 0.96 10/12/2017 1543   CALCIUM 9.5 10/12/2017 1543    GFRNONAA 92 11/26/2016 1051   GFRAA 107 11/26/2016 1051    Lipid Panel     Component Value Date/Time   CHOL 179 10/12/2017 1543   TRIG 123.0 10/12/2017 1543   HDL 53.70 10/12/2017 1543   CHOLHDL 3 10/12/2017 1543   VLDL 24.6 10/12/2017 1543   LDLCALC 101 (H) 10/12/2017 1543    CBC    Component Value Date/Time   WBC 8.4 10/12/2017 1543   RBC 4.42 10/12/2017 1543   HGB 14.3 10/12/2017 1543   HGB 14.1 11/29/2016 0917   HCT 42.1 10/12/2017 1543   HCT 42.4 11/29/2016 0917   PLT 222.0 10/12/2017 1543   PLT 284 11/29/2016 0917   MCV 95.2 10/12/2017 1543   MCV 95 11/29/2016 0917   MCH 31.7 11/29/2016 0917   MCH 32.3 09/10/2016 1729   MCHC 33.9 10/12/2017 1543   RDW 13.8 10/12/2017 1543   RDW 13.5 11/29/2016 0917   LYMPHSABS 0.8 11/29/2016 0917   MONOABS 0.6 12/18/2013 1124   EOSABS 0.0 11/29/2016 0917   BASOSABS 0.0 11/29/2016 0917    Hgb A1C No results found for: HGBA1C         Assessment & Plan:   Preventative Health Maintenance:  Encouraged him to get a flu shot in the fall Tetanus UTD Colon screening UTD Encouraged her to consume a balanced diet and exercise regimen Advised her to see an eye doctor and dentist annually Will check CBC, CMET, Lipid profile and PSA today  RTC in 1 year, sooner if needed Webb Silversmith, NP

## 2019-03-21 LAB — COMPREHENSIVE METABOLIC PANEL
ALT: 21 U/L (ref 0–53)
AST: 21 U/L (ref 0–37)
Albumin: 4 g/dL (ref 3.5–5.2)
Alkaline Phosphatase: 69 U/L (ref 39–117)
BUN: 12 mg/dL (ref 6–23)
CO2: 30 mEq/L (ref 19–32)
Calcium: 9.5 mg/dL (ref 8.4–10.5)
Chloride: 103 mEq/L (ref 96–112)
Creatinine, Ser: 0.93 mg/dL (ref 0.40–1.50)
GFR: 84.09 mL/min (ref >60.00)
Glucose, Bld: 80 mg/dL (ref 70–99)
Potassium: 4.4 mEq/L (ref 3.5–5.1)
Sodium: 140 mEq/L (ref 135–145)
Total Bilirubin: 0.3 mg/dL (ref 0.2–1.2)
Total Protein: 6.6 g/dL (ref 6.0–8.3)

## 2019-03-21 LAB — CBC
HCT: 40.3 % (ref 39.0–52.0)
Hemoglobin: 13.8 g/dL (ref 13.0–17.0)
MCHC: 34.1 g/dL (ref 30.0–36.0)
MCV: 96.6 fl (ref 78.0–100.0)
Platelets: 241 10*3/uL (ref 150.0–400.0)
RBC: 4.17 Mil/uL — ABNORMAL LOW (ref 4.22–5.81)
RDW: 13.3 % (ref 11.5–15.5)
WBC: 8.2 10*3/uL (ref 4.0–10.5)

## 2019-03-21 LAB — LIPID PANEL
Cholesterol: 177 mg/dL (ref 0–200)
HDL: 53.4 mg/dL (ref >39.00)
LDL Cholesterol: 95 mg/dL (ref 0–99)
NonHDL: 123.4
Total CHOL/HDL Ratio: 3
Triglycerides: 143 mg/dL (ref 0.0–149.0)
VLDL: 28.6 mg/dL (ref 0.0–40.0)

## 2019-03-21 LAB — PSA: PSA: 0.87 ng/mL (ref 0.10–4.00)

## 2019-03-22 ENCOUNTER — Other Ambulatory Visit: Payer: Self-pay | Admitting: Internal Medicine

## 2019-03-22 DIAGNOSIS — J449 Chronic obstructive pulmonary disease, unspecified: Secondary | ICD-10-CM

## 2019-03-22 DIAGNOSIS — F17200 Nicotine dependence, unspecified, uncomplicated: Secondary | ICD-10-CM

## 2019-03-26 ENCOUNTER — Telehealth: Payer: Self-pay | Admitting: *Deleted

## 2019-03-26 DIAGNOSIS — Z87891 Personal history of nicotine dependence: Secondary | ICD-10-CM

## 2019-03-26 NOTE — Telephone Encounter (Signed)
Received referral for initial lung cancer screening scan. Contacted patient and obtained smoking history,(current, 36 pack year) as well as answering questions related to screening process. Patient denies signs of lung cancer such as weight loss or hemoptysis. Patient denies comorbidity that would prevent curative treatment if lung cancer were found. Patient is scheduled for shared decision making visit and CT scan on 04/03/19 at 130pm.

## 2019-04-03 ENCOUNTER — Other Ambulatory Visit: Payer: Self-pay

## 2019-04-03 ENCOUNTER — Ambulatory Visit
Admission: RE | Admit: 2019-04-03 | Discharge: 2019-04-03 | Disposition: A | Discharge status: Home / Self Care | Payer: BC Managed Care – PPO | Source: Ambulatory Visit | Attending: Nurse Practitioner | Admitting: Nurse Practitioner

## 2019-04-03 ENCOUNTER — Inpatient Hospital Stay: Payer: BC Managed Care – PPO | Attending: Nurse Practitioner | Admitting: Oncology

## 2019-04-03 DIAGNOSIS — Z87891 Personal history of nicotine dependence: Secondary | ICD-10-CM

## 2019-04-03 NOTE — Progress Notes (Signed)
Virtual Visit via Video Note  I connected with Benjamin Ball  on 04/03/19 at  1:30 PM EDT by a video enabled telemedicine application and verified that I am speaking with the correct person using two identifiers.  Location: Patient: [facility] Provider: [facility]   I discussed the limitations of evaluation and management by telemedicine and the availability of in person appointments. The patient expressed understanding and agreed to proceed.  I discussed the assessment and treatment plan with the patient. The patient was provided an opportunity to ask questions and all were answered. The patient agreed with the plan and demonstrated an understanding of the instructions.   The patient was advised to call back or seek an in-person evaluation if the symptoms worsen or if the condition fails to improve as anticipated.   In accordance with CMS guidelines, patient has met eligibility criteria including age, absence of signs or symptoms of lung cancer.  Social History   Tobacco Use  . Smoking status: Current Every Day Smoker    Packs/day: 1.00    Years: 36.00    Pack years: 36.00    Types: Cigarettes  . Smokeless tobacco: Never Used  Substance Use Topics  . Alcohol use: Yes    Alcohol/week: 7.0 standard drinks    Types: 7 Standard drinks or equivalent per week    Comment: beer, wine, liquor -2/3 BEERS/DAY  . Drug use: No      A shared decision-making session was conducted prior to the performance of CT scan. This includes one or more decision aids, includes benefits and harms of screening, follow-up diagnostic testing, over-diagnosis, false positive rate, and total radiation exposure.   Counseling on the importance of adherence to annual lung cancer LDCT screening, impact of co-morbidities, and ability or willingness to undergo diagnosis and treatment is imperative for compliance of the program.   Counseling on the importance of continued smoking cessation for former smokers; the importance of  smoking cessation for current smokers, and information about tobacco cessation interventions have been given to patient including [place] and [facility] quit [place] programs.   Written order for lung cancer screening with LDCT has been given to the patient and any and all questions have been answered to the best of my abilities.    Yearly follow up will be coordinated by Burgess Estelle, Thoracic Navigator.  I provided 15 minutes of face-to-face video visit time during this encounter, and > 50% was spent counseling as documented under my assessment & plan.   Jacquelin Hawking, NP

## 2019-04-06 ENCOUNTER — Encounter: Payer: Self-pay | Admitting: *Deleted

## 2019-10-18 ENCOUNTER — Ambulatory Visit: Payer: Self-pay | Admitting: Nurse Practitioner

## 2019-10-18 ENCOUNTER — Other Ambulatory Visit: Payer: Self-pay

## 2019-10-18 ENCOUNTER — Encounter: Payer: Self-pay | Admitting: Nurse Practitioner

## 2019-10-18 VITALS — BP 127/85 | HR 87 | Temp 97.8°F | Ht 71.0 in | Wt 167.8 lb

## 2019-10-18 DIAGNOSIS — L089 Local infection of the skin and subcutaneous tissue, unspecified: Secondary | ICD-10-CM

## 2019-10-18 DIAGNOSIS — L235 Allergic contact dermatitis due to other chemical products: Secondary | ICD-10-CM

## 2019-10-18 MED ORDER — CEPHALEXIN 500 MG PO CAPS: 500.0000 mg | ORAL_CAPSULE | Freq: Two times a day (BID) | ORAL | 0 refills | Status: AC

## 2019-10-18 MED ORDER — PREDNISONE 10 MG (21) PO TBPK: ORAL_TABLET | ORAL | 0 refills | Status: DC

## 2019-10-18 NOTE — Progress Notes (Signed)
Subjective:    Patient ID: Benjamin Ball, male    DOB: 1965-04-29, 55 y.o.   MRN: [id number]  HPI  54 year old male  Here with complaints of a rash to bilateral armpits that started about one week ago. Rash started after he got his work uniforms back from the cleaning service, he typically does not use the cleaning service because the starch irritates his skin.   The rash started to bilateral armpits and has continued to get worse over the past week. He switched to his wive's deodorant (secret) without improvement. He then started to use an OTC oil for comfort which did give him mild relief.   Today he has painful lumps on her left armpit mainly. Denies fever or new body aches or other systemic symptoms.   Washed with a Lavender Ivory liquid soap.  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    Review of Systems  Constitutional: Negative.   HENT: Negative.   Skin: Positive for color change and rash.   Today's Vitals   10/18/19 1004  BP: 127/85  Pulse: 87  Temp: 97.8 F (36.6 C)  TempSrc: Temporal  SpO2: 99%  Weight: 167 lb 12.8 oz (76.1 kg)  Height: 5\' 11"  (1.803 m)   Body mass index is 23.4 kg/m.   Allergies  Allergen Reactions  . Latex     WEARING GLOVES FOR AN EXTENDED PERIOD OF TIME   Current Outpatient Medications  Medication Instructions  . acetaminophen (TYLENOL) 500 mg, Oral, As needed  . albuterol (PROVENTIL HFA;VENTOLIN HFA) 108 (90 Base) MCG/ACT inhaler 2 puffs, Inhalation, Every 6 hours PRN  . aspirin-acetaminophen-caffeine (EXCEDRIN MIGRAINE) 250-250-65 MG tablet Oral, Every 6 hours PRN  . cephALEXin (KEFLEX) 500 mg, Oral, 2 times daily  . cetirizine (ZYRTEC) 10 mg, Oral, Daily  . fluticasone (FLONASE) 50 MCG/ACT nasal spray 2 sprays, Each Nare, Daily  . Fluticasone-Salmeterol (ADVAIR DISKUS) 250-50 MCG/DOSE AEPB 1 puff, Inhalation, 2 times daily  . predniSONE (STERAPRED UNI-PAK 21 TAB) 10 MG (21) TBPK tablet Take 6 tablets on day one, 5 tablets on day two, 4  tablets on day three, 3 tablets on day four, 2 tablets on day five, 1 tablet on day 6. Take with food      Objective:   Physical Exam Constitutional:      Appearance: Normal appearance.  Musculoskeletal:     Cervical back: Neck supple.  Lymphadenopathy:     Upper Body:     Right upper body: Axillary adenopathy present.     Left upper body: Axillary adenopathy present.  Skin:    General: Skin is warm.     Findings: Erythema and rash present. Rash is macular and papular.     Comments: Left axillary region with 1 inch oval raised erythematous region tender and warm to touch.  Scattered erythematous papular rash throughout bilateral axillary regions that spreads down sides of chest  Surrounding skin WNL. Skin intact.   Neurological:     Mental Status: He is alert.           Assessment & Plan:   Stop using Deoderant while skin is irritated. May use topical natural lotion if providing relief without pain or further irritation. Use only non scented/fragranced soaps as discussed.   Start prednisone taper (take with food as discussed) Abx for likely secondary infection to left axillary region  RTC if symptoms persist or with new concerns as discussed.   When restarting Deoderant choose one that is more natural without  scent/avoid antiperspirants.

## 2019-10-18 NOTE — Patient Instructions (Addendum)
Start prednisone taper and antibiotic  Avoid soaps that are scented   Return to clinic if symptoms persist or with new concerns as discussed

## 2020-02-22 ENCOUNTER — Encounter: Payer: Self-pay | Admitting: Gastroenterology

## 2020-03-19 ENCOUNTER — Telehealth: Payer: Self-pay

## 2020-03-20 ENCOUNTER — Ambulatory Visit (INDEPENDENT_AMBULATORY_CARE_PROVIDER_SITE_OTHER): Payer: BC Managed Care – PPO | Admitting: Internal Medicine

## 2020-03-20 ENCOUNTER — Encounter: Payer: Self-pay | Admitting: Internal Medicine

## 2020-03-20 ENCOUNTER — Other Ambulatory Visit: Payer: Self-pay

## 2020-03-20 VITALS — BP 112/70 | HR 74 | Temp 98.1°F | Ht 70.0 in | Wt 171.0 lb

## 2020-03-20 DIAGNOSIS — K219 Gastro-esophageal reflux disease without esophagitis: Secondary | ICD-10-CM | POA: Diagnosis not present

## 2020-03-20 DIAGNOSIS — Z1159 Encounter for screening for other viral diseases: Secondary | ICD-10-CM | POA: Diagnosis not present

## 2020-03-20 DIAGNOSIS — Z114 Encounter for screening for human immunodeficiency virus [HIV]: Secondary | ICD-10-CM | POA: Diagnosis not present

## 2020-03-20 DIAGNOSIS — J449 Chronic obstructive pulmonary disease, unspecified: Secondary | ICD-10-CM | POA: Diagnosis not present

## 2020-03-20 DIAGNOSIS — G8929 Other chronic pain: Secondary | ICD-10-CM | POA: Diagnosis not present

## 2020-03-20 DIAGNOSIS — M5442 Lumbago with sciatica, left side: Secondary | ICD-10-CM | POA: Diagnosis not present

## 2020-03-20 DIAGNOSIS — Z0001 Encounter for general adult medical examination with abnormal findings: Secondary | ICD-10-CM

## 2020-03-20 DIAGNOSIS — Z0000 Encounter for general adult medical examination without abnormal findings: Secondary | ICD-10-CM

## 2020-03-20 DIAGNOSIS — E7800 Pure hypercholesterolemia, unspecified: Secondary | ICD-10-CM

## 2020-03-20 DIAGNOSIS — J4520 Mild intermittent asthma, uncomplicated: Secondary | ICD-10-CM

## 2020-03-20 DIAGNOSIS — Z125 Encounter for screening for malignant neoplasm of prostate: Secondary | ICD-10-CM | POA: Diagnosis not present

## 2020-03-20 DIAGNOSIS — Z1152 Encounter for screening for COVID-19: Secondary | ICD-10-CM

## 2020-03-20 NOTE — Assessment & Plan Note (Signed)
Not medicated Not interested in smoking at this time Declined lung cancer screening exam

## 2020-03-20 NOTE — Assessment & Plan Note (Signed)
CMET and lipid profile today Encouraged him to consume a low fat diet 

## 2020-03-20 NOTE — Assessment & Plan Note (Signed)
No real issues off meds Will monitor

## 2020-03-20 NOTE — Patient Instructions (Signed)

## 2020-03-20 NOTE — Assessment & Plan Note (Signed)
Managed with [facility]C

## 2020-03-20 NOTE — Progress Notes (Signed)
Subjective:    Patient ID: Benjamin Ball, male    DOB: Jul 10, 1965, 55 y.o.   MRN: [id number]  HPI  Patient presents the clinic today for his annual exam.  He is also due to follow-up chronic conditions.  HLD: His last LDL was 95, 03/2019.  He is not taking any cholesterol-lowering medication at this time.  He tries to consume a low-fat diet.  GERD: Currently not an issue.  He is not taking any medications OTC for this.  There is no upper GI on file.  COPD: He reports occasional cough and shortness of breath with exertion.  He does continue to smoke.  He has refused lung cancer screenings.  He is not using any inhalers at this time.  There are no PFTs on file.  Chronic back pain: Managed with [facility].  Flu: never Tetanus: 07/2012 Pneumovax: 10/2017 Covid: never PSA screening: 10/2017 Colon screening: 10/2017 Vision screen: annually Dentist: biannually  Diet: He does eat meat. He consumes fruits and veggies. He does eat some fried foods. He drinks mostly coffee Exercise: Very active at work  Review of Systems      Past Medical History:  Diagnosis Date  . Allergy   . Anxiety   . Arthritis   . Boil 04/09/2016   LEG-PT ENCOURAGED TO GET PCP TO LOOK AT LEG BEFORE UPCOMING SURGERY  . Chronic back pain    SCIATICA  . Cold   . GERD (gastroesophageal reflux disease)    occasional-TUMS PRN  . Heartburn   . Hyperlipidemia     Current Outpatient Medications  Medication Sig Dispense Refill  . acetaminophen (TYLENOL) 500 MG tablet Take 500 mg by mouth as needed.     . Acetaminophen-Codeine 300-30 MG tablet acetaminophen 300 mg-codeine 30 mg tablet    . albuterol (PROVENTIL HFA;VENTOLIN HFA) 108 (90 Base) MCG/ACT inhaler Inhale 2 puffs every 6 (six) hours as needed into the lungs for wheezing or shortness of breath. 1 Inhaler 1  . aspirin-acetaminophen-caffeine (EXCEDRIN MIGRAINE) [phone] MG tablet Take by mouth every 6 (six) hours as needed for headache.    . cetirizine  (ZYRTEC) 10 MG tablet Take 10 mg by mouth daily.    . cyclobenzaprine (FLEXERIL) 10 MG tablet cyclobenzaprine 10 mg tablet  TAKE 1 TABLET BY MOUTH TWICE DAILY    . ergocalciferol (VITAMIN D2) 1.25 MG (50000 UT) capsule ergocalciferol (vitamin D2) 1,250 mcg (50,000 unit) capsule    . fluticasone (FLONASE) 50 MCG/ACT nasal spray Place 2 sprays into both nostrils daily. (Patient taking differently: Place 2 sprays into both nostrils as needed. ) 16 g 2  . Fluticasone-Salmeterol (ADVAIR DISKUS) 250-50 MCG/DOSE AEPB Inhale 1 puff into the lungs 2 (two) times daily. 1 each 0  . meloxicam (MOBIC) 15 MG tablet meloxicam 15 mg tablet  TAKE 1 TABLET BY MOUTH ONCE DAILY    . oxyCODONE (OXY IR/ROXICODONE) 5 MG immediate release tablet oxycodone 5 mg tablet  Take 1-2 tablet(s) EVERY 4 HOURS by oral route.    . predniSONE (STERAPRED UNI-PAK 21 TAB) 10 MG (21) TBPK tablet Take 6 tablets on day one, 5 tablets on day two, 4 tablets on day three, 3 tablets on day four, 2 tablets on day five, 1 tablet on day 6. Take with food 21 tablet 0  . traMADol (ULTRAM) 50 MG tablet tramadol 50 mg tablet  TAKE 1 TABLET BY MOUTH EVERY 4 HOURS     No current facility-administered medications for this visit.    Allergies  Allergen Reactions  . Latex     WEARING GLOVES FOR AN EXTENDED PERIOD OF TIME    Family History  Problem Relation Age of Onset  . Heart disease Father   . Colon cancer Neg Hx   . Rectal cancer Neg Hx   . Stomach cancer Neg Hx   . Colon polyps Neg Hx   . Esophageal cancer Neg Hx     Social History   Socioeconomic History  . Marital status: Married    Spouse name: Not on file  . Number of children: Not on file  . Years of education: Not on file  . Highest education level: Not on file  Occupational History  . Not on file  Tobacco Use  . Smoking status: Current Every Day Smoker    Packs/day: 1.00    Years: 36.00    Pack years: 36.00    Types: Cigarettes  . Smokeless tobacco: Never Used   Substance and Sexual Activity  . Alcohol use: Yes    Alcohol/week: 7.0 standard drinks    Types: 7 Standard drinks or equivalent per week    Comment: beer, wine, liquor -2/3 BEERS/DAY  . Drug use: No  . Sexual activity: Not on file  Other Topics Concern  . Not on file  Social History Narrative   Married to [name], daughter is [name].      Works for [employer] in [employer].   Social Determinants of Health   Financial Resource Strain: Not on file  Food Insecurity: Not on file  Transportation Needs: Not on file  Physical Activity: Not on file  Stress: Not on file  Social Connections: Not on file  Intimate Partner Violence: Not on file     Constitutional: Denies fever, malaise, fatigue, headache or abrupt weight changes.  HEENT: Denies eye pain, eye redness, ear pain, ringing in the ears, wax buildup, runny nose, nasal congestion, bloody nose, or sore throat. Respiratory: Pt reports intermittent cough and shortness of breath. Denies difficulty breathing, or sputum production.   Cardiovascular: Denies chest pain, chest tightness, palpitations or swelling in the hands or feet.  Gastrointestinal: Denies abdominal pain, bloating, constipation, diarrhea or blood in the stool.  GU: Denies urgency, frequency, pain with urination, burning sensation, blood in urine, odor or discharge. Musculoskeletal: Pt reports chronic back pain. Denies decrease in range of motion, difficulty with gait, or joint swelling.  Skin: Denies redness, rashes, lesions or ulcercations.  Neurological: Denies dizziness, difficulty with memory, difficulty with speech or problems with balance and coordination.  Psych: Denies anxiety, depression, SI/HI.  No other specific complaints in a complete review of systems (except as listed in HPI above).  Objective:   Physical Exam  BP 112/70   Pulse 74   Temp 98.1 F (36.7 C) (Temporal)   Ht $R'5\' 10"'DN$  (1.778 m)   Wt 171 lb (77.6 kg)   SpO2 97%    BMI 24.54 kg/m   Wt Readings from Last 3 Encounters:  10/18/19 167 lb 12.8 oz (76.1 kg)  04/03/19 166 lb (75.3 kg)  03/20/19 166 lb (75.3 kg)    General: Appears his stated age, well developed, well nourished in NAD. Skin: Warm, dry and intact. No rashes noted. HEENT: Head: normal shape and size; Eyes: sclera white, no icterus, conjunctiva pink, PERRLA and EOMs intact;  Neck:  Neck supple, trachea midline. No masses, lumps or thyromegaly present.  Cardiovascular: Normal rate and rhythm. S1,S2 noted.  No murmur, rubs or gallops noted. No  JVD or BLE edema. No carotid bruits noted. Pulmonary/Chest: Normal effort and positive vesicular breath sounds. No respiratory distress. No wheezes, rales or ronchi noted.  Abdomen: Soft and nontender. Normal bowel sounds. No distention or masses noted. Liver, spleen and kidneys non palpable. Musculoskeletal: Strength 5/5 BUE/BLE. No difficulty with gait.  Neurological: Alert and oriented. Cranial nerves II-XII grossly intact. Coordination normal.  Psychiatric: Mood and affect normal. Behavior is normal. Judgment and thought content normal.    BMET    Component Value Date/Time   NA 140 03/20/2019 1554   NA 140 11/26/2016 1051   K 4.4 03/20/2019 1554   CL 103 03/20/2019 1554   CO2 30 03/20/2019 1554   GLUCOSE 80 03/20/2019 1554   BUN 12 03/20/2019 1554   BUN 9 11/26/2016 1051   CREATININE 0.93 03/20/2019 1554   CALCIUM 9.5 03/20/2019 1554   GFRNONAA 92 11/26/2016 1051   GFRAA 107 11/26/2016 1051    Lipid Panel     Component Value Date/Time   CHOL 177 03/20/2019 1554   TRIG 143.0 03/20/2019 1554   HDL 53.40 03/20/2019 1554   CHOLHDL 3 03/20/2019 1554   VLDL 28.6 03/20/2019 1554   LDLCALC 95 03/20/2019 1554    CBC    Component Value Date/Time   WBC 8.2 03/20/2019 1554   RBC 4.17 (L) 03/20/2019 1554   HGB 13.8 03/20/2019 1554   HGB 14.1 11/29/2016 0917   HCT 40.3 03/20/2019 1554   HCT 42.4 11/29/2016 0917   PLT 241.0 03/20/2019  1554   PLT 284 11/29/2016 0917   MCV 96.6 03/20/2019 1554   MCV 95 11/29/2016 0917   MCH 31.7 11/29/2016 0917   MCH 32.3 09/10/2016 1729   MCHC 34.1 03/20/2019 1554   RDW 13.3 03/20/2019 1554   RDW 13.5 11/29/2016 0917   LYMPHSABS 0.8 11/29/2016 0917   MONOABS 0.6 12/18/2013 1124   EOSABS 0.0 11/29/2016 0917   BASOSABS 0.0 11/29/2016 0917    Hgb A1C No results found for: HGBA1C        Assessment & Plan:   Preventative Health Maintenance:  He declines flu shot Tetanus UTD Pneumovax UTD He declines Covid vaccine Colon screening UTD Encouraged him to consume a balanced diet exercise regimen Advised him to see an eye doctor and dentist annually We will check CBC, c-Met, lipid profile, PSA, HIV and hep C today  RTC in 1 year, sooner if needed Webb Silversmith, NP This visit occurred during the SARS-CoV-2 public health emergency.  Safety protocols were in place, including screening questions prior to the visit, additional usage of staff PPE, and extensive cleaning of exam room while observing appropriate contact time as indicated for disinfecting solutions.

## 2020-03-21 ENCOUNTER — Encounter: Payer: Self-pay | Admitting: Internal Medicine

## 2020-03-21 LAB — COMPREHENSIVE METABOLIC PANEL
ALT: 20 IU/L (ref 0–44)
AST: 22 IU/L (ref 0–40)
Albumin/Globulin Ratio: 1.9 (ref 1.2–2.2)
Albumin: 4.8 g/dL (ref 3.8–4.9)
Alkaline Phosphatase: 104 IU/L (ref 44–121)
BUN/Creatinine Ratio: 15 (ref 9–20)
BUN: 12 mg/dL (ref 6–24)
Bilirubin Total: 0.2 mg/dL (ref 0.0–1.2)
CO2: 23 mmol/L (ref 20–29)
Calcium: 9.5 mg/dL (ref 8.7–10.2)
Chloride: 103 mmol/L (ref 96–106)
Creatinine, Ser: 0.82 mg/dL (ref 0.76–1.27)
Globulin, Total: 2.5 g/dL (ref 1.5–4.5)
Glucose: 104 mg/dL — ABNORMAL HIGH (ref 65–99)
Potassium: 4.8 mmol/L (ref 3.5–5.2)
Sodium: 141 mmol/L (ref 134–144)
Total Protein: 7.3 g/dL (ref 6.0–8.5)
eGFR: 103 mL/min/{1.73_m2} (ref >59)

## 2020-03-21 LAB — LIPID PANEL
Chol/HDL Ratio: 3.7 ratio (ref 0.0–5.0)
Cholesterol, Total: 201 mg/dL — ABNORMAL HIGH (ref 100–199)
HDL: 54 mg/dL (ref >39)
LDL Chol Calc (NIH): 128 mg/dL — ABNORMAL HIGH (ref 0–99)
Triglycerides: 105 mg/dL (ref 0–149)
VLDL Cholesterol Cal: 19 mg/dL (ref 5–40)

## 2020-03-21 LAB — HIV ANTIBODY (ROUTINE TESTING W REFLEX): HIV Screen 4th Generation wRfx: NONREACTIVE

## 2020-03-21 LAB — HEPATITIS C ANTIBODY: Hep C Virus Ab: <0.1 s/co ratio (ref 0.0–0.9)

## 2020-03-21 LAB — SAR COV2 SEROLOGY (COVID19)AB(IGG),IA
SARS-CoV-2 Semi-Quant IgG Ab: <13 AU/mL (ref <13.0)
SARS-CoV-2 Spike Ab Interp: NEGATIVE

## 2020-03-21 LAB — CBC
Hematocrit: 42.9 % (ref 37.5–51.0)
Hemoglobin: 14.6 g/dL (ref 13.0–17.7)
MCH: 32 pg (ref 26.6–33.0)
MCHC: 34 g/dL (ref 31.5–35.7)
MCV: 94 fL (ref 79–97)
Platelets: 291 10*3/uL (ref 150–450)
RBC: 4.56 x10E6/uL (ref 4.14–5.80)
RDW: 12 % (ref 11.6–15.4)
WBC: 9.9 10*3/uL (ref 3.4–10.8)

## 2020-03-21 LAB — PSA: Prostate Specific Ag, Serum: 0.9 ng/mL (ref 0.0–4.0)

## 2020-03-21 LAB — SARS-COV-2 ANTIBODY, IGM

## 2020-04-22 ENCOUNTER — Ambulatory Visit: Payer: Self-pay

## 2020-04-22 ENCOUNTER — Telehealth: Payer: Self-pay | Admitting: Medical

## 2020-04-22 ENCOUNTER — Other Ambulatory Visit: Payer: Self-pay

## 2020-04-22 DIAGNOSIS — F17200 Nicotine dependence, unspecified, uncomplicated: Secondary | ICD-10-CM

## 2020-04-22 DIAGNOSIS — Z1152 Encounter for screening for COVID-19: Secondary | ICD-10-CM

## 2020-04-22 DIAGNOSIS — J40 Bronchitis, not specified as acute or chronic: Secondary | ICD-10-CM

## 2020-04-22 DIAGNOSIS — Z20822 Contact with and (suspected) exposure to covid-19: Secondary | ICD-10-CM

## 2020-04-22 DIAGNOSIS — R062 Wheezing: Secondary | ICD-10-CM

## 2020-04-22 LAB — POC COVID19 BINAXNOW: SARS Coronavirus 2 Ag: NEGATIVE

## 2020-04-22 MED ORDER — ALBUTEROL SULFATE HFA 108 (90 BASE) MCG/ACT IN AERS: 2.0000 | INHALATION_SPRAY | Freq: Four times a day (QID) | RESPIRATORY_TRACT | 2 refills | Status: DC | PRN

## 2020-04-22 MED ORDER — AZITHROMYCIN 250 MG PO TABS: ORAL_TABLET | ORAL | 0 refills | Status: DC

## 2020-04-22 NOTE — Progress Notes (Addendum)
Work note completed for patient.  Subjective:    Patient ID: Benjamin Ball, male    DOB: February 07, 1965, 55 y.o.   MRN: [id number]  HPI 55 yo male in non acute distress consents to telemedicine appointment. "I only smoked a handful of cigerettes today" Started  1.5-2 weeks with drainage, cough productive green, after coughing sometimes feels short of breath.  Smoker   ( 1ppd, sometimes more). Denies fever or chills.  O2 sat 94% unvaccinated  Review of Systems  Constitutional: Positive for fatigue. Negative for chills and fever.  HENT: Positive for congestion, ear pain (right with blowing nose, popping), postnasal drip, rhinorrhea and sneezing. Negative for ear discharge and sore throat.   Respiratory: Positive for cough, chest tightness, shortness of breath (after coughing) and wheezing (sometimes).   Cardiovascular: Positive for chest pain (right side , after coughing).  Gastrointestinal: Positive for diarrhea (on and off). Negative for abdominal pain.  Genitourinary: Negative for difficulty urinating.  Musculoskeletal: Negative for myalgias.       History of arthritis in legs and in hands  Skin: Negative for color change and rash.       Dry hands  Allergic/Immunologic: Positive for environmental allergies.  Neurological: Positive for headaches (often). Negative for dizziness, syncope and light-headedness.  Psychiatric/Behavioral: Negative for behavioral problems.       Objective:   Physical Exam AXOX3  No distress during phone call No physical exam performed due to telemedicne appointment.   No cough noted on phone call    Results for orders placed or performed in visit on 04/22/20 (from the past 24 hour(s))  POC COVID-19     Status: Normal   Collection Time: 04/22/20  1:56 PM  Result Value Ref Range   SARS Coronavirus 2 Ag Negative Negative   Assessment & Plan:   Covid-19 screening Bronchitis Wheezing Smoker  Rest, increase fluids, OTC Mortrin or Tylenol per package  instructions for pain or fever. OTC Delsym or Robitussin per package instructions for cough. Change non sedative anti histimine to Allegra. Call clinic if symptoms worsen, increase shortness of breath or chest pain. Patient verbalizes understanding and has no questions at the end of our conversation.

## 2020-04-22 NOTE — Patient Instructions (Addendum)
Rest, increase fluids, OTC Mortrin or Tylenol per package instructions for pain or fever. OTC Delsym or Robitussin per package instructions for cough. Change non sedative anti histimine to Allegra. Call clinic if symptoms worsen, increase shortness of breath or chest pain if moderate to severe go to your local hospital for evaluation.    Cough, Adult A cough helps to clear your throat and lungs. A cough may be a sign of an illness or another medical condition. An acute cough may only last 2-3 weeks, while a chronic cough may last 8 or more weeks. Many things can cause a cough. They include:  Germs (viruses or bacteria) that attack the airway.  Breathing in things that bother (irritate) your lungs.  Allergies.  Asthma.  Mucus that runs down the back of your throat (postnasal drip).  Smoking.  Acid backing up from the stomach into the tube that moves food from the mouth to the stomach (gastroesophageal reflux).  Some medicines.  Lung problems.  Other medical conditions, such as heart failure or a blood clot in the lung (pulmonary embolism). Follow these instructions at home: Medicines  Take over-the-counter and prescription medicines only as told by your doctor.  Talk with your doctor before you take medicines that stop a cough (cough suppressants). Lifestyle  Do not smoke, and try not to be around smoke. Do not use any products that contain nicotine or tobacco, such as cigarettes, e-cigarettes, and chewing tobacco. If you need help quitting, ask your doctor.  Drink enough fluid to keep your pee (urine) pale yellow.  Avoid caffeine.  Do not drink alcohol if your doctor tells you not to drink.   General instructions  Watch for any changes in your cough. Tell your doctor about them.  Always cover your mouth when you cough.  Stay away from things that make you cough, such as perfume, candles, campfire smoke, or cleaning products.  If the air is dry, use a cool mist  vaporizer or humidifier in your home.  If your cough is worse at night, try using extra pillows to raise your head up higher while you sleep.  Rest as needed.  Keep all follow-up visits as told by your doctor. This is important.   Contact a doctor if:  You have new symptoms.  You cough up pus.  Your cough does not get better after 2-3 weeks, or your cough gets worse.  Cough medicine does not help your cough and you are not sleeping well.  You have pain that gets worse or pain that is not helped with medicine.  You have a fever.  You are losing weight and you do not know why.  You have night sweats. Get help right away if:  You cough up blood.  You have trouble breathing.  Your heartbeat is very fast. These symptoms may be an emergency. Do not wait to see if the symptoms will go away. Get medical help right away. Call your local emergency services (911 in the [place] Do not drive yourself to the hospital. Summary  A cough helps to clear your throat and lungs. Many things can cause a cough.  Take over-the-counter and prescription medicines only as told by your doctor.  Always cover your mouth when you cough.  Contact a doctor if you have new symptoms or you have a cough that does not get better or gets worse. This information is not intended to replace advice given to you by your health care provider. Make sure you discuss  any questions you have with your health care provider. Document Revised: 02/16/2019 Document Reviewed: 01/16/2018 Elsevier Patient Education  2021 [place]. Acute Bronchitis, Adult  Acute bronchitis is when air tubes in the lungs (bronchi) suddenly get swollen. The condition can make it hard for you to breathe. In adults, acute bronchitis usually goes away within 2 weeks. A cough caused by bronchitis may last up to 3 weeks. Smoking, allergies, and asthma can make the condition worse. What are the causes? This condition is caused by:  Cold and flu  viruses. The most common cause of this condition is the virus that causes the common cold.  Bacteria.  Substances that irritate the lungs, including: ? Smoke from cigarettes and other types of tobacco. ? Dust and pollen. ? Fumes from chemicals, gases, or burned fuel. ? Other materials that pollute indoor or outdoor air.  Close contact with someone who has acute bronchitis. What increases the risk? The following factors may make you more likely to develop this condition:  A weak body's defense system. This is also called the immune system.  Any condition that affects your lungs and breathing, such as asthma. What are the signs or symptoms? Symptoms of this condition include:  A cough.  Coughing up clear, yellow, or green mucus.  Wheezing.  Chest congestion.  Shortness of breath.  A fever.  Body aches.  Chills.  A sore throat. How is this treated? Acute bronchitis may go away over time without treatment. Your doctor may recommend:  Drinking more fluids.  Taking a medicine for a fever or cough.  Using a device that gets medicine into your lungs (inhaler).  Using a vaporizer or a humidifier. These are machines that add water or moisture in the air to help with coughing and poor breathing. Follow these instructions at home: Activity  Get a lot of rest.  Avoid places where there are fumes from chemicals.  Return to your normal activities as told by your doctor. Ask your doctor what activities are safe for you. Lifestyle  Drink enough fluids to keep your pee (urine) pale yellow.  Do not drink alcohol.  Do not use any products that contain nicotine or tobacco, such as cigarettes, e-cigarettes, and chewing tobacco. If you need help quitting, ask your doctor. Be aware that: ? Your bronchitis will get worse if you smoke or breathe in other people's smoke (secondhand smoke). ? Your lungs will heal faster if you quit smoking. General instructions  Take  over-the-counter and prescription medicines only as told by your doctor.  Use an inhaler, cool mist vaporizer, or humidifier as told by your doctor.  Rinse your mouth often with salt water. To make salt water, dissolve -1 tsp (3-6 g) of salt in 1 cup (237 mL) of warm water.  Keep all follow-up visits as told by your doctor. This is important.   How is this prevented? To lower your risk of getting this condition again:  Wash your hands often with soap and water. If soap and water are not available, use hand sanitizer.  Avoid contact with people who have cold symptoms.  Try not to touch your mouth, nose, or eyes with your hands.  Make sure to get the flu shot every year.   Contact a doctor if:  Your symptoms do not get better in 2 weeks.  You vomit more than once or twice.  You have symptoms of loss of fluid from your body (dehydration). These include: ? Dark urine. ? Dry skin  or eyes. ? Increased thirst. ? Headaches. ? Confusion. ? Muscle cramps. Get help right away if:  You cough up blood.  You have chest pain.  You have very bad shortness of breath.  You become dehydrated.  You faint or keep feeling like you are going to faint.  You keep vomiting.  You have a very bad headache.  Your fever or chills get worse. These symptoms may be an emergency. Do not wait to see if the symptoms will go away. Get medical help right away. Call your local emergency services (911 in the [place] Do not drive yourself to the hospital. Summary  Acute bronchitis is when air tubes in the lungs (bronchi) suddenly get swollen. In adults, acute bronchitis usually goes away within 2 weeks.  Take over-the-counter and prescription medicines only as told by your doctor.  Drink enough fluid to keep your pee (urine) pale yellow.  Contact a doctor if your symptoms do not improve after 2 weeks of treatment.  Get help right away if you cough up blood, faint, or have chest pain or shortness of  breath. This information is not intended to replace advice given to you by your health care provider. Make sure you discuss any questions you have with your health care provider. Document Revised: 07/21/2018 Document Reviewed: 07/21/2018 Elsevier Patient Education  2021 [place].  Smoking Tobacco Information, Adult Smoking tobacco can be harmful to your health. Tobacco contains a poisonous (toxic), colorless chemical called nicotine. Nicotine is addictive. It changes the brain and can make it hard to stop smoking. Tobacco also has other toxic chemicals that can hurt your body and raise your risk of many cancers. How can smoking tobacco affect me? Smoking tobacco puts you at risk for:  Cancer. Smoking is most commonly associated with lung cancer, but can also lead to cancer in other parts of the body.  Chronic obstructive pulmonary disease (COPD). This is a long-term lung condition that makes it hard to breathe. It also gets worse over time.  High blood pressure (hypertension), heart disease, stroke, or heart attack.  Lung infections, such as pneumonia.  Cataracts. This is when the lenses in the eyes become clouded.  Digestive problems. This may include peptic ulcers, heartburn, and gastroesophageal reflux disease (GERD).  Oral health problems, such as gum disease and tooth loss.  Loss of taste and smell. Smoking can affect your appearance by causing:  Wrinkles.  Yellow or stained teeth, fingers, and fingernails. Smoking tobacco can also affect your social life, because:  It may be challenging to find places to smoke when away from home. Many workplaces, [employer], hotels, and public places are tobacco-free.  Smoking is expensive. This is due to the cost of tobacco and the long-term costs of treating health problems from smoking.  Secondhand smoke may affect those around you. Secondhand smoke can cause lung cancer, breathing problems, and heart disease. Children of smokers  have a higher risk for: ? Sudden infant death syndrome (SIDS). ? Ear infections. ? Lung infections. If you currently smoke tobacco, quitting now can help you:  Lead a longer and healthier life.  Look, smell, breathe, and feel better over time.  Save money.  Protect others from the harms of secondhand smoke. What actions can I take to prevent health problems? Quit smoking  Do not start smoking. Quit if you already do.  Make a plan to quit smoking and commit to it. Look for programs to help you and ask your health care provider for  recommendations and ideas.  Set a date and write down all the reasons you want to quit.  Let your friends and family know you are quitting so they can help and support you. Consider finding friends who also want to quit. It can be easier to quit with someone else, so that you can support each other.  Talk with your health care provider about using nicotine replacement medicines to help you quit, such as gum, lozenges, patches, sprays, or pills.  Do not replace cigarette smoking with electronic cigarettes, which are commonly called e-cigarettes. The safety of e-cigarettes is not known, and some may contain harmful chemicals.  If you try to quit but return to smoking, stay positive. It is common to slip up when you first quit, so take it one day at a time.  Be prepared for cravings. When you feel the urge to smoke, chew gum or suck on hard candy.   Lifestyle  Stay busy and take care of your body.  Drink enough fluid to keep your urine pale yellow.  Get plenty of exercise and eat a healthy diet. This can help prevent weight gain after quitting.  Monitor your eating habits. Quitting smoking can cause you to have a larger appetite than when you smoke.  Find ways to relax. Go out with friends or family to a movie or a restaurant where people do not smoke.  Ask your health care provider about having regular tests (screenings) to check for cancer. This may  include blood tests, imaging tests, and other tests.  Find ways to manage your stress, such as meditation, yoga, or exercise. Where to find support To get support to quit smoking, consider:  Asking your health care provider for more information and resources.  Taking classes to learn more about quitting smoking.  Looking for local organizations that offer resources about quitting smoking.  Joining a support group for people who want to quit smoking in your local community.  Calling the smokefree.gov counselor helpline: 1-800-Quit-Now 915-481-0920) Where to find more information You may find more information about quitting smoking from:  HelpGuide.org: www.helpguide.org  https://hall.com/: smokefree.gov  American Lung Association: www.lung.org Contact a health care provider if you:  Have problems breathing.  Notice that your lips, nose, or fingers turn blue.  Have chest pain.  Are coughing up blood.  Feel faint or you pass out.  Have other health changes that cause you to worry. Summary  Smoking tobacco can negatively affect your health, the health of those around you, your finances, and your social life.  Do not start smoking. Quit if you already do. If you need help quitting, ask your health care provider.  Think about joining a support group for people who want to quit smoking in your local community. There are many effective programs that will help you to quit this behavior. This information is not intended to replace advice given to you by your health care provider. Make sure you discuss any questions you have with your health care provider. Document Revised: 09/22/2018 Document Reviewed: 01/13/2016 Elsevier Patient Education  2021 [employer].

## 2020-07-11 ENCOUNTER — Other Ambulatory Visit: Payer: Self-pay

## 2020-07-11 ENCOUNTER — Ambulatory Visit: Payer: Self-pay | Admitting: Nurse Practitioner

## 2020-07-11 DIAGNOSIS — Z20822 Contact with and (suspected) exposure to covid-19: Secondary | ICD-10-CM

## 2020-07-11 LAB — POC COVID19 BINAXNOW: SARS Coronavirus 2 Ag: NEGATIVE

## 2020-07-11 NOTE — Progress Notes (Signed)
   Subjective:    Patient ID: Benjamin Ball, male    DOB: [birth year], 55 y.o.   MRN: [id number]  HPI  55 year old male.   Here for close contact testing.  His wife has COVID currently, last exposure was today.  Patient denies symptoms  He has not been vaccinated for COVID-19   Objective:   Physical Exam  There was no physical exam performed for this visit. This was a nurse visit for COVID-19 testing, patient talked on the phone with provider and was in no acute distress during conversation.   Recent Results (from the past 2160 hour(s))  POC COVID-19     Status: Normal   Collection Time: 04/22/20  1:56 PM  Result Value Ref Range   SARS Coronavirus 2 Ag Negative Negative    Comment: Patient unvaccinated. Patient aware of negative result. F/u with H. Ratcliffe  POC COVID-19     Status: Normal   Collection Time: 07/11/20 11:42 AM  Result Value Ref Range   SARS Coronavirus 2 Ag Negative Negative    Comment: unvaccinated patient. aware of negative POC result. Has telephone appt with Marigene Ehlers FNP.        Assessment & Plan:   Recommended follow up next week if PCR is also negative.  He should be retested 3-5 days after exposure to COVID or earlier if symptoms onset.

## 2020-07-12 ENCOUNTER — Telehealth: Payer: Self-pay | Admitting: Nurse Practitioner

## 2020-07-12 LAB — NOVEL CORONAVIRUS, NAA: SARS-CoV-2, NAA: NOT DETECTED

## 2020-07-12 LAB — SARS-COV-2, NAA 2 DAY TAT

## 2020-07-12 NOTE — Telephone Encounter (Signed)
COVID PCR negative.  Patient to follow up on 07/15/2020 for repeat testing as discussed.   Recent Results (from the past 2160 hour(s))  POC COVID-19     Status: Normal   Collection Time: 04/22/20  1:56 PM  Result Value Ref Range   SARS Coronavirus 2 Ag Negative Negative    Comment: Patient unvaccinated. Patient aware of negative result. F/u with H. Ratcliffe  POC COVID-19     Status: Normal   Collection Time: 07/11/20 11:42 AM  Result Value Ref Range   SARS Coronavirus 2 Ag Negative Negative    Comment: unvaccinated patient. aware of negative POC result. Has telephone appt with Marigene Ehlers FNP.  Novel Coronavirus, NAA ([facility])     Status: None   Collection Time: 07/11/20 11:43 AM   Specimen: Nasopharyngeal(NP) swabs in vial transport medium   Nasopharynge  Is this  Result Value Ref Range   SARS-CoV-2, NAA Not Detected Not Detected    Comment: This nucleic acid amplification test was developed and its performance characteristics determined by [employer]. Nucleic acid amplification tests include RT-PCR and TMA. This test has not been FDA cleared or approved. This test has been authorized by FDA under an Emergency Use Authorization (EUA). This test is only authorized for the duration of time the declaration that circumstances exist justifying the authorization of the emergency use of in vitro diagnostic tests for detection of SARS-CoV-2 virus and/or diagnosis of COVID-19 infection under section 564(b)(1) of the Act, 21 U.S.C. 628ZMO-2(H) (1), unless the authorization is terminated or revoked sooner. When diagnostic testing is negative, the possibility of a false negative result should be considered in the context of a patient's recent exposures and the presence of clinical signs and symptoms consistent with COVID-19. An individual without symptoms of COVID-19 and who is not shedding SARS-CoV-2 virus wo uld expect to have a negative (not detected) result in this assay.    SARS-COV-2, NAA 2 DAY TAT     Status: None   Collection Time: 07/11/20 11:43 AM   Nasopharynge  Is this  Result Value Ref Range   SARS-CoV-2, NAA 2 DAY TAT Performed

## 2020-07-15 ENCOUNTER — Encounter: Payer: Self-pay | Admitting: Medical

## 2020-07-15 ENCOUNTER — Ambulatory Visit: Payer: Self-pay | Admitting: Medical

## 2020-07-15 ENCOUNTER — Other Ambulatory Visit: Payer: Self-pay

## 2020-07-15 VITALS — BP 110/60 | HR 83 | Temp 99.0°F | Resp 16

## 2020-07-15 DIAGNOSIS — Z789 Other specified health status: Secondary | ICD-10-CM

## 2020-07-15 LAB — POC COVID19 BINAXNOW: SARS Coronavirus 2 Ag: NEGATIVE

## 2020-07-15 NOTE — Patient Instructions (Signed)
10 Things You Can Do to Manage Your COVID-19 Symptoms at Home If you have possible or confirmed COVID-19 Stay home except to get medical care. Monitor your symptoms carefully. If your symptoms get worse, call your healthcare provider immediately. Get rest and stay hydrated. If you have a medical appointment, call the healthcare provider ahead of time and tell them that you have or may have COVID-19. For medical emergencies, call 911 and notify the dispatch personnel that you have or may have COVID-19. Cover your cough and sneezes with a tissue or use the inside of your elbow. Wash your hands often with soap and water for at least 20 seconds or clean your hands with an alcohol-based hand sanitizer that contains at least 60% alcohol. As much as possible, stay in a specific room and away from other people in your home. Also, you should use a separate bathroom, if available. If you need to be around other people in or outside of the home, wear a mask. Avoid sharing personal items with other people in your household, like dishes, towels, and bedding. Clean all surfaces that are touched often, like counters, tabletops, and doorknobs. Use household cleaning sprays or wipes according to the label instructions. cdc.gov/coronavirus 07/27/2019 This information is not intended to replace advice given to you by your health care provider. Make sure you discuss any questions you have with your healthcare provider. Document Revised: 02/15/2020 Document Reviewed: 02/15/2020 Elsevier Patient Education  2022 Elsevier Inc.  

## 2020-07-15 NOTE — Progress Notes (Signed)
Subjective:    Patient ID: Benjamin Ball, male    DOB: 1965-06-18, 55 y.o.   MRN: [id number]  HPI 55 yo male in non acute distress. Here for retesting of Covid-19 after 5 days of close contact exposure ( wife).  No complaints today.  Uses generic Zyrtec. For allergies.   07/11/2020 PCR Ciovid-19 test was negative.  Blood pressure 110/60, pulse 83, temperature 99 F (37.2 C), temperature source Tympanic, resp. rate 16, SpO2 96 %.  Unvaccinated Review of Systems  Constitutional:  Negative for chills and fever.  HENT:  Positive for postnasal drip (I always have this). Negative for congestion, rhinorrhea, sinus pressure, sinus pain and sore throat.    Smoking 1ppd Has been outside a lot during the last  2 weeks. Not using Albuterol MDI at all.    Objective:   Physical Exam Constitutional:      Appearance: Normal appearance.  HENT:     Head: Normocephalic and atraumatic.  Eyes:     Extraocular Movements: Extraocular movements intact.     Conjunctiva/sclera: Conjunctivae normal.     Pupils: Pupils are equal, round, and reactive to light.  Pulmonary:     Effort: No respiratory distress.     Breath sounds: Wheezing and rhonchi present. No rales.  Musculoskeletal:        General: Normal range of motion.  Skin:    General: Skin is warm and dry.  Neurological:     General: No focal deficit present.     Mental Status: He is alert and oriented to person, place, and time.  Psychiatric:        Mood and Affect: Mood normal.        Behavior: Behavior normal.        Thought Content: Thought content normal.        Judgment: Judgment normal.      Results for orders placed or performed in visit on 07/15/20 (from the past 24 hour(s))  POC COVID-19     Status: Normal   Collection Time: 07/15/20  2:15 PM  Result Value Ref Range   SARS Coronavirus 2 Ag Negative Negative    Covid -19 PCR Pending   Assessment & Plan:  Close Contact exposure. Asymptomatic for Covid-19  today Recommended  Flonase for post nasal dirip. Smoker, wheezing , recommend he use inhaler BID x 7 days and if he hears wheezing to use inhaler. Covid-19  POC Negative Covid-19 PCR Pending Work note completed.  Patient to remain off of work till  [place] has resulted and Reviewed by me or Apolonio Schneiders NP. Patient verbalizes understanding and has no questions at discharge.  Orders Placed This Encounter  Procedures   Novel Coronavirus, NAA ([facility])    Order Specific Question:   Is this test for diagnosis or screening    Answer:   Diagnosis of ill patient    Order Specific Question:   Symptomatic for COVID-19 as defined by CDC    Answer:   Yes    Order Specific Question:   Date of Symptom Onset    Answer:   07/11/2020    Order Specific Question:   Hospitalized for COVID-19    Answer:   No    Order Specific Question:   Admitted to ICU for COVID-19    Answer:   No    Order Specific Question:   Previously tested for COVID-19    Answer:   Yes    Order Specific Question:   Resident  in a congregate (group) care setting    Answer:   No    Order Specific Question:   Is the patient student?    Answer:   No    Order Specific Question:   Employed in healthcare setting    Answer:   No    Order Specific Question:   Has patient completed COVID vaccination(s) (2 doses of Pfizer/Moderna 1 dose of [place])    Answer:   No    Order Specific Question:   Release to patient    Answer:   Immediate   POC COVID-19    Order Specific Question:   Is this test for diagnosis or screening    Answer:   Screening    Order Specific Question:   Symptomatic for COVID-19 as defined by CDC    Answer:   No    Order Specific Question:   Hospitalized for COVID-19    Answer:   No    Order Specific Question:   Admitted to ICU for COVID-19    Answer:   No    Order Specific Question:   Previously tested for COVID-19    Answer:   Yes    Order Specific Question:   Resident in a congregate (group) care  setting    Answer:   No    Order Specific Question:   Employed in healthcare setting    Answer:   No

## 2020-07-15 NOTE — Progress Notes (Signed)
Done

## 2020-07-16 LAB — SARS-COV-2, NAA 2 DAY TAT

## 2020-07-16 LAB — NOVEL CORONAVIRUS, NAA: SARS-CoV-2, NAA: NOT DETECTED

## 2020-07-17 ENCOUNTER — Telehealth: Payer: Self-pay | Admitting: Nurse Practitioner

## 2020-07-17 NOTE — Telephone Encounter (Signed)
PCR for COVID-19 negative.   May return to work now. Will print for patient.   Recent Results (from the past 2160 hour(s))  POC COVID-19     Status: Normal   Collection Time: 04/22/20  1:56 PM  Result Value Ref Range   SARS Coronavirus 2 Ag Negative Negative    Comment: Patient unvaccinated. Patient aware of negative result. F/u with H. Ratcliffe  POC COVID-19     Status: Normal   Collection Time: 07/11/20 11:42 AM  Result Value Ref Range   SARS Coronavirus 2 Ag Negative Negative    Comment: unvaccinated patient. aware of negative POC result. Has telephone appt with Marigene Ehlers FNP.  Novel Coronavirus, NAA ([facility])     Status: None   Collection Time: 07/11/20 11:43 AM   Specimen: Nasopharyngeal(NP) swabs in vial transport medium   Nasopharynge  Is this  Result Value Ref Range   SARS-CoV-2, NAA Not Detected Not Detected    Comment: This nucleic acid amplification test was developed and its performance characteristics determined by [employer]. Nucleic acid amplification tests include RT-PCR and TMA. This test has not been FDA cleared or approved. This test has been authorized by FDA under an Emergency Use Authorization (EUA). This test is only authorized for the duration of time the declaration that circumstances exist justifying the authorization of the emergency use of in vitro diagnostic tests for detection of SARS-CoV-2 virus and/or diagnosis of COVID-19 infection under section 564(b)(1) of the Act, 21 U.S.C. 338SNK-5(L) (1), unless the authorization is terminated or revoked sooner. When diagnostic testing is negative, the possibility of a false negative result should be considered in the context of a patient's recent exposures and the presence of clinical signs and symptoms consistent with COVID-19. An individual without symptoms of COVID-19 and who is not shedding SARS-CoV-2 virus wo uld expect to have a negative (not detected) result in this assay.   SARS-COV-2,  NAA 2 DAY TAT     Status: None   Collection Time: 07/11/20 11:43 AM   Nasopharynge  Is this  Result Value Ref Range   SARS-CoV-2, NAA 2 DAY TAT Performed   Novel Coronavirus, NAA ([facility])     Status: None   Collection Time: 07/15/20  2:15 PM   Specimen: Nasopharyngeal(NP) swabs in vial transport medium   Nasopharynge  Result Value Ref Range   SARS-CoV-2, NAA Not Detected Not Detected    Comment: This nucleic acid amplification test was developed and its performance characteristics determined by [employer]. Nucleic acid amplification tests include RT-PCR and TMA. This test has not been FDA cleared or approved. This test has been authorized by FDA under an Emergency Use Authorization (EUA). This test is only authorized for the duration of time the declaration that circumstances exist justifying the authorization of the emergency use of in vitro diagnostic tests for detection of SARS-CoV-2 virus and/or diagnosis of COVID-19 infection under section 564(b)(1) of the Act, 21 U.S.C. 976BHA-1(P) (1), unless the authorization is terminated or revoked sooner. When diagnostic testing is negative, the possibility of a false negative result should be considered in the context of a patient's recent exposures and the presence of clinical signs and symptoms consistent with COVID-19. An individual without symptoms of COVID-19 and who is not shedding SARS-CoV-2 virus wo uld expect to have a negative (not detected) result in this assay.   POC COVID-19     Status: Normal   Collection Time: 07/15/20  2:15 PM  Result Value Ref Range  SARS Coronavirus 2 Ag Negative Negative    Comment: Patient unvaccinated. Patient aware of negative result. F/u with H Ratcliffe  SARS-COV-2, NAA 2 DAY TAT     Status: None   Collection Time: 07/15/20  2:15 PM   Nasopharynge  Result Value Ref Range   SARS-CoV-2, NAA 2 DAY TAT Performed

## 2020-07-21 ENCOUNTER — Encounter: Payer: Self-pay | Admitting: Medical

## 2021-01-30 ENCOUNTER — Encounter: Payer: Self-pay | Admitting: Gastroenterology

## 2021-02-16 ENCOUNTER — Other Ambulatory Visit: Payer: Self-pay

## 2021-02-16 ENCOUNTER — Ambulatory Visit (AMBULATORY_SURGERY_CENTER): Payer: BC Managed Care – PPO | Admitting: *Deleted

## 2021-02-16 VITALS — Ht 70.0 in | Wt 165.0 lb

## 2021-02-16 DIAGNOSIS — Z86010 Personal history of colonic polyps: Secondary | ICD-10-CM

## 2021-02-16 MED ORDER — NA SULFATE-K SULFATE-MG SULF 17.5-3.13-1.6 GM/177ML PO SOLN: 1.0000 | Freq: Once | ORAL | 0 refills | Status: AC

## 2021-02-16 NOTE — Progress Notes (Signed)
No egg or soy allergy known to patient  No issues known to pt with past sedation with any surgeries or procedures Patient denies ever being told they had issues or difficulty with intubation  No FH of Malignant Hyperthermia Pt is not on diet pills Pt is not on  home 02  Pt is not on blood thinners  Pt denies issues with constipation  No A fib or A flutter  Pt is not vaccinated  for Covid   NO PA's for preps discussed with pt In PV today  Discussed with pt there will be an out-of-pocket cost for prep and that varies from $0 to 70 +  dollars - pt verbalized understanding   Due to the COVID-19 pandemic we are asking patients to follow certain guidelines in PV and the [place]   Pt aware of COVID protocols and [facility] guidelines   PV completed over the phone. Pt verified name, DOB, address and insurance during PV today.  Pt mailed instruction packet with copy of consent form to read and not return, and instructions.  Pt encouraged to call with questions or issues.  If pt has My chart, procedure instructions sent via My Chart

## 2021-02-20 ENCOUNTER — Other Ambulatory Visit: Payer: Self-pay

## 2021-02-20 ENCOUNTER — Ambulatory Visit: Payer: Self-pay | Admitting: Nurse Practitioner

## 2021-02-20 VITALS — BP 118/72 | HR 82 | Temp 98.2°F | Resp 16

## 2021-02-20 DIAGNOSIS — M2550 Pain in unspecified joint: Secondary | ICD-10-CM

## 2021-02-20 MED ORDER — PREDNISONE 20 MG PO TABS: ORAL_TABLET | ORAL | 0 refills | Status: DC

## 2021-02-20 NOTE — Progress Notes (Signed)
° °  Subjective:    Patient ID: Benjamin Ball, male    DOB: [birth year], 56 y.o.   MRN: [id number]  HPI  56 year old male presenting to [facility] with complaints of arthritic pains in shoulders, back, legs, knees, feet and hands. When he wakes up in the am he can not pull on his pants or put on his belt due to hand stiffness.   He has had a lumbar fusion in the past.    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   He is seeing [facility] Silversmith as a PCP most recently seen 03/2020.  Today's Vitals   02/20/21 1038  BP: 118/72  Pulse: 82  Resp: 16  Temp: 98.2 F (36.8 C)  TempSrc: Tympanic  SpO2: 98%   There is no height or weight on file to calculate BMI.   Review of Systems  Constitutional: Negative.   HENT: Negative.    Eyes: Negative.   Respiratory: Negative.    Cardiovascular: Negative.   Gastrointestinal: Negative.   Musculoskeletal:  Positive for arthralgias.  Neurological: Negative.       Objective:   Physical Exam HENT:     Head: Normocephalic.  Musculoskeletal:     Right hand: Swelling present. Decreased range of motion. Decreased strength.     Left hand: Swelling present. Decreased range of motion. Decreased strength.     Cervical back: Normal range of motion.     Lumbar back: Tenderness present.     Right knee: Tenderness present.  Skin:    General: Skin is warm.  Neurological:     Mental Status: He is alert.     Sensory: Sensation is intact.     Motor: Weakness present.     Coordination: Coordination is intact.     Gait: Gait is intact.     Comments: + phalen's to both hands.    Psychiatric:        Mood and Affect: Mood normal.          Assessment & Plan:  1. Arthralgia, unspecified joint Discussed maximum daily dosing of tylenol.  Hold Advil until finished with prednisone taper  Then can resume as discussed daily dosing given to patient as well and instructed to take with food   -  Rheumatoid Factor - CBC w/Diff - Comprehensive metabolic panel - C-reactive protein - ANA - Sedimentation rate  Will follow up with lab results next week and make referral as needed  Compression wrist and hand gloves given to patient, may wear while working and while sleeping.   Will order CT brace to sleep in.   - predniSONE (DELTASONE) 20 MG tablet; Twice daily for one week, then once daily for one week. Take with food.  Dispense: 21 tablet; Refill: 0

## 2021-02-21 LAB — COMPREHENSIVE METABOLIC PANEL
ALT: 16 IU/L (ref 0–44)
AST: 16 IU/L (ref 0–40)
Albumin/Globulin Ratio: 1.6 (ref 1.2–2.2)
Albumin: 4.3 g/dL (ref 3.8–4.9)
Alkaline Phosphatase: 106 IU/L (ref 44–121)
BUN/Creatinine Ratio: 15 (ref 9–20)
BUN: 10 mg/dL (ref 6–24)
Bilirubin Total: 0.2 mg/dL (ref 0.0–1.2)
CO2: 22 mmol/L (ref 20–29)
Calcium: 9.1 mg/dL (ref 8.7–10.2)
Chloride: 103 mmol/L (ref 96–106)
Creatinine, Ser: 0.67 mg/dL — ABNORMAL LOW (ref 0.76–1.27)
Globulin, Total: 2.7 g/dL (ref 1.5–4.5)
Glucose: 100 mg/dL — ABNORMAL HIGH (ref 70–99)
Potassium: 4.4 mmol/L (ref 3.5–5.2)
Sodium: 142 mmol/L (ref 134–144)
Total Protein: 7 g/dL (ref 6.0–8.5)
eGFR: 109 mL/min/{1.73_m2} (ref >59)

## 2021-02-21 LAB — CBC WITH DIFFERENTIAL/PLATELET
Basophils Absolute: 0.1 10*3/uL (ref 0.0–0.2)
Basos: 1 %
EOS (ABSOLUTE): 0.4 10*3/uL (ref 0.0–0.4)
Eos: 4 %
Hematocrit: 41.2 % (ref 37.5–51.0)
Hemoglobin: 14.1 g/dL (ref 13.0–17.7)
Immature Grans (Abs): 0 10*3/uL (ref 0.0–0.1)
Immature Granulocytes: 0 %
Lymphocytes Absolute: 2.3 10*3/uL (ref 0.7–3.1)
Lymphs: 23 %
MCH: 31.9 pg (ref 26.6–33.0)
MCHC: 34.2 g/dL (ref 31.5–35.7)
MCV: 93 fL (ref 79–97)
Monocytes Absolute: 1 10*3/uL — ABNORMAL HIGH (ref 0.1–0.9)
Monocytes: 10 %
Neutrophils Absolute: 6.3 10*3/uL (ref 1.4–7.0)
Neutrophils: 62 %
Platelets: 353 10*3/uL (ref 150–450)
RBC: 4.42 x10E6/uL (ref 4.14–5.80)
RDW: 11.9 % (ref 11.6–15.4)
WBC: 10.1 10*3/uL (ref 3.4–10.8)

## 2021-02-21 LAB — SEDIMENTATION RATE: Sed Rate: 62 mm/hr — ABNORMAL HIGH (ref 0–30)

## 2021-02-21 LAB — RHEUMATOID FACTOR: Rheumatoid fact SerPl-aCnc: <10 IU/mL (ref <14.0)

## 2021-02-21 LAB — C-REACTIVE PROTEIN: CRP: 25 mg/L — ABNORMAL HIGH (ref 0–10)

## 2021-02-21 LAB — ANA: Anti Nuclear Antibody (ANA): NEGATIVE

## 2021-02-26 ENCOUNTER — Ambulatory Visit: Payer: Self-pay | Admitting: Nurse Practitioner

## 2021-02-26 ENCOUNTER — Other Ambulatory Visit: Payer: Self-pay

## 2021-02-26 DIAGNOSIS — M2550 Pain in unspecified joint: Secondary | ICD-10-CM

## 2021-02-26 NOTE — Progress Notes (Signed)
Spoke with patient regarding lab results- suggested follow up with PCP, patient is due for annual physical and he is scheduled for a colonoscopy next week as well.   He has had major improvement on prednisone- discussed with patient that this is no an ideal long term solution. Elevated inflammatory markers warrant further workup, patient prefers starting with PCP to avoid specialist copay at this time.   Offered Rheumatology referral Patient will also stop by to be fitted for Carpal Tunnel brace to sleep in. He has been wearing compression sleeves as well.   Recent Results (from the past 2160 hour(s))  Rheumatoid Factor     Status: None   Collection Time: 02/20/21 11:23 AM  Result Value Ref Range   Rhuematoid fact SerPl-aCnc <10.0 <14.0 IU/mL  CBC w/Diff     Status: Abnormal   Collection Time: 02/20/21 11:23 AM  Result Value Ref Range   WBC 10.1 3.4 - 10.8 x10E3/uL   RBC 4.42 4.14 - 5.80 x10E6/uL   Hemoglobin 14.1 13.0 - 17.7 g/dL   Hematocrit 41.2 37.5 - 51.0 %   MCV 93 79 - 97 fL   MCH 31.9 26.6 - 33.0 pg   MCHC 34.2 31.5 - 35.7 g/dL   RDW 11.9 11.6 - 15.4 %   Platelets 353 150 - 450 x10E3/uL   Neutrophils 62 Not Estab. %   Lymphs 23 Not Estab. %   Monocytes 10 Not Estab. %   Eos 4 Not Estab. %   Basos 1 Not Estab. %   Neutrophils Absolute 6.3 1.4 - 7.0 x10E3/uL   Lymphocytes Absolute 2.3 0.7 - 3.1 x10E3/uL   Monocytes Absolute 1.0 (H) 0.1 - 0.9 x10E3/uL   EOS (ABSOLUTE) 0.4 0.0 - 0.4 x10E3/uL   Basophils Absolute 0.1 0.0 - 0.2 x10E3/uL   Immature Granulocytes 0 Not Estab. %   Immature Grans (Abs) 0.0 0.0 - 0.1 x10E3/uL  Comprehensive metabolic panel     Status: Abnormal   Collection Time: 02/20/21 11:23 AM  Result Value Ref Range   Glucose 100 (H) 70 - 99 mg/dL   BUN 10 6 - 24 mg/dL   Creatinine, Ser 0.67 (L) 0.76 - 1.27 mg/dL   eGFR 109 >59 mL/min/1.73   BUN/Creatinine Ratio 15 9 - 20   Sodium 142 134 - 144 mmol/L   Potassium 4.4 3.5 - 5.2 mmol/L   Chloride 103 96 -  106 mmol/L   CO2 22 20 - 29 mmol/L   Calcium 9.1 8.7 - 10.2 mg/dL   Total Protein 7.0 6.0 - 8.5 g/dL   Albumin 4.3 3.8 - 4.9 g/dL   Globulin, Total 2.7 1.5 - 4.5 g/dL   Albumin/Globulin Ratio 1.6 1.2 - 2.2   Bilirubin Total 0.2 0.0 - 1.2 mg/dL   Alkaline Phosphatase 106 44 - 121 IU/L   AST 16 0 - 40 IU/L   ALT 16 0 - 44 IU/L  C-reactive protein     Status: Abnormal   Collection Time: 02/20/21 11:23 AM  Result Value Ref Range   CRP 25 (H) 0 - 10 mg/L  ANA     Status: None   Collection Time: 02/20/21 11:23 AM  Result Value Ref Range   Anti Nuclear Antibody (ANA) Negative Negative  Sedimentation rate     Status: Abnormal   Collection Time: 02/20/21 11:23 AM  Result Value Ref Range   Sed Rate 62 (H) 0 - 30 mm/hr

## 2021-02-27 ENCOUNTER — Encounter: Payer: Self-pay | Admitting: Gastroenterology

## 2021-03-03 ENCOUNTER — Ambulatory Visit (AMBULATORY_SURGERY_CENTER): Payer: BC Managed Care – PPO | Admitting: Gastroenterology

## 2021-03-03 ENCOUNTER — Encounter: Payer: Self-pay | Admitting: Gastroenterology

## 2021-03-03 ENCOUNTER — Other Ambulatory Visit: Payer: Self-pay | Admitting: Gastroenterology

## 2021-03-03 VITALS — BP 113/66 | HR 78 | Temp 98.6°F | Resp 10

## 2021-03-03 DIAGNOSIS — Z86010 Personal history of colonic polyps: Secondary | ICD-10-CM

## 2021-03-03 DIAGNOSIS — D124 Benign neoplasm of descending colon: Secondary | ICD-10-CM

## 2021-03-03 DIAGNOSIS — D128 Benign neoplasm of rectum: Secondary | ICD-10-CM | POA: Diagnosis not present

## 2021-03-03 DIAGNOSIS — D125 Benign neoplasm of sigmoid colon: Secondary | ICD-10-CM | POA: Diagnosis not present

## 2021-03-03 DIAGNOSIS — D127 Benign neoplasm of rectosigmoid junction: Secondary | ICD-10-CM | POA: Diagnosis not present

## 2021-03-03 DIAGNOSIS — Z1211 Encounter for screening for malignant neoplasm of colon: Secondary | ICD-10-CM | POA: Diagnosis not present

## 2021-03-03 MED ORDER — SODIUM CHLORIDE 0.9 % IV SOLN: 500.0000 mL | Freq: Once | INTRAVENOUS | Status: DC

## 2021-03-03 NOTE — Progress Notes (Signed)
VSS, transported to PACU °

## 2021-03-03 NOTE — Progress Notes (Signed)
Called to room to assist during endoscopic procedure.  Patient ID and intended procedure confirmed with present staff. Received instructions for my participation in the procedure from the performing physician.  

## 2021-03-03 NOTE — Op Note (Signed)
[facility] Patient Name: Benjamin Ball Procedure Date: 03/03/2021 2:13 PM MRN: [id number] Endoscopist: Ladene Artist , MD Age: 56 Referring MD:  Date of Birth: Nov 11, 1965 Gender: Male Account #: [id number] Procedure:                Colonoscopy Indications:              Surveillance: Personal history of adenomatous                            polyps on last colonoscopy > 3 years ago Medicines:                Monitored Anesthesia Care Procedure:                Pre-Anesthesia Assessment:                           - Prior to the procedure, a History and Physical                            was performed, and patient medications and                            allergies were reviewed. The patient's tolerance of                            previous anesthesia was also reviewed. The risks                            and benefits of the procedure and the sedation                            options and risks were discussed with the patient.                            All questions were answered, and informed consent                            was obtained. Prior Anticoagulants: The patient has                            taken no previous anticoagulant or antiplatelet                            agents. ASA Grade Assessment: II - A patient with                            mild systemic disease. After reviewing the risks                            and benefits, the patient was deemed in                            satisfactory condition to undergo the procedure.  After obtaining informed consent, the colonoscope                            was passed under direct vision. Throughout the                            procedure, the patient's blood pressure, pulse, and                            oxygen saturations were monitored continuously. The                            CF [id number] #[id number] was introduced through the anus                            and advanced to the  the cecum, identified by                            appendiceal orifice and ileocecal valve. The                            ileocecal valve, appendiceal orifice, and rectum                            were photographed. The quality of the bowel                            preparation was good. The colonoscopy was performed                            without difficulty. The patient tolerated the                            procedure well. Scope In: 2:21:32 PM Scope Out: 2:38:12 PM Scope Withdrawal Time: 0 hours 14 minutes 46 seconds  Total Procedure Duration: 0 hours 16 minutes 40 seconds  Findings:                 The perianal and digital rectal examinations were                            normal.                           Five sessile polyps were found in the rectum (1),                            sigmoid colon (2) and descending colon (2). The                            polyps were 6 to 8 mm in size. These polyps were                            removed with a cold snare. Resection and retrieval  were complete.                           Internal hemorrhoids were found during                            retroflexion. The hemorrhoids were small and Grade                            I (internal hemorrhoids that do not prolapse).                           The exam was otherwise without abnormality on                            direct and retroflexion views. Complications:            No immediate complications. Estimated blood loss:                            None. Estimated Blood Loss:     Estimated blood loss: none. Impression:               - Five 6 to 8 mm polyps in the rectum, in the                            sigmoid colon and in the descending colon, removed                            with a cold snare. Resected and retrieved.                           - Internal hemorrhoids.                           - The examination was otherwise normal on direct                             and retroflexion views. Recommendation:           - Repeat colonoscopy after studies are complete for                            surveillance based on pathology results.                           - Patient has a contact number available for                            emergencies. The signs and symptoms of potential                            delayed complications were discussed with the                            patient. Return to normal activities tomorrow.  Written discharge instructions were provided to the                            patient.                           - Resume previous diet.                           - Continue present medications.                           - Await pathology results. Ladene Artist, MD 03/03/2021 2:41:25 PM This report has been signed electronically.

## 2021-03-03 NOTE — Progress Notes (Signed)
History & Physical  Primary Care Physician:  Jearld Fenton, NP Primary Gastroenterologist: Lucio Edward, MD  CHIEF COMPLAINT:  Personal history of colon polyps   HPI: Benjamin Ball is a 56 y.o. male with a personal history of multiple adenomatous colon polyps on colonoscopy 2019 here for surveillance colonoscopy.    Past Medical History:  Diagnosis Date   Allergy    Anxiety    Arthritis    Boil 04/09/2016   LEG-PT ENCOURAGED TO GET PCP TO LOOK AT LEG BEFORE UPCOMING SURGERY   Chronic back pain    SCIATICA   Cold    GERD (gastroesophageal reflux disease)    occasional-TUMS PRN   Heartburn    Hyperlipidemia    controlled    Past Surgical History:  Procedure Laterality Date   BACK SURGERY     COLONOSCOPY     HAND SURGERY Right    INGUINAL HERNIA REPAIR     age 9 or so   KNEE SURGERY     POLYPECTOMY     SPERMATOCELECTOMY Right 04/19/2016   Procedure: SPERMATOCELECTOMY;  Surgeon: Hollice Espy, MD;  Location: [facility] ORS;  Service: Urology;  Laterality: Right;   WISDOM TOOTH EXTRACTION      Prior to Admission medications   Medication Sig Start Date End Date Taking? Authorizing Provider  predniSONE (DELTASONE) 20 MG tablet Twice daily for one week, then once daily for one week. Take with food. 02/20/21  Yes Apolonio Schneiders, FNP  acetaminophen (TYLENOL) 500 MG tablet Take 500 mg by mouth as needed.     [provider]  albuterol (VENTOLIN HFA) 108 (90 Base) MCG/ACT inhaler Inhale 2 puffs into the lungs every 6 (six) hours as needed for wheezing or shortness of breath. 04/22/20   Ratcliffe, Estill Dooms, PA-C  aspirin-acetaminophen-caffeine (EXCEDRIN MIGRAINE) [phone] MG tablet Take by mouth every 6 (six) hours as needed for headache.    [provider]  B Complex-C (SUPER B COMPLEX PO) Take by mouth.    [provider]  cetirizine (ZYRTEC) 10 MG tablet Take 10 mg by mouth daily.    [provider]  Cholecalciferol (EQL VITAMIN D3) 25  MCG (1000 UT) capsule Take 5,000 Units by mouth daily.    [provider]  Menaquinone-7 (VITAMIN K2) 100 MCG CAPS Take by mouth.    [provider]  Zinc 50 MG CAPS Take by mouth.    [provider]    Current Outpatient Medications  Medication Sig Dispense Refill   predniSONE (DELTASONE) 20 MG tablet Twice daily for one week, then once daily for one week. Take with food. 21 tablet 0   acetaminophen (TYLENOL) 500 MG tablet Take 500 mg by mouth as needed.      albuterol (VENTOLIN HFA) 108 (90 Base) MCG/ACT inhaler Inhale 2 puffs into the lungs every 6 (six) hours as needed for wheezing or shortness of breath. 8 g 2   aspirin-acetaminophen-caffeine (EXCEDRIN MIGRAINE) [phone] MG tablet Take by mouth every 6 (six) hours as needed for headache.     B Complex-C (SUPER B COMPLEX PO) Take by mouth.     cetirizine (ZYRTEC) 10 MG tablet Take 10 mg by mouth daily.     Cholecalciferol (EQL VITAMIN D3) 25 MCG (1000 UT) capsule Take 5,000 Units by mouth daily.     Menaquinone-7 (VITAMIN K2) 100 MCG CAPS Take by mouth.     Zinc 50 MG CAPS Take by mouth.     Current Facility-Administered Medications  Medication Dose Route Frequency Provider Last Rate Last Admin   0.9 %  sodium chloride infusion  500 mL Intravenous Once Ladene Artist, MD        Allergies as of 03/03/2021 - Review Complete 03/03/2021  Allergen Reaction Noted   Latex  04/09/2016    Family History  Problem Relation Age of Onset   Heart disease Father    Breast cancer Sister    Bone cancer Sister        mets to bones   Breast cancer Sister    Colon cancer Neg Hx    Rectal cancer Neg Hx    Stomach cancer Neg Hx    Colon polyps Neg Hx    Esophageal cancer Neg Hx     Social History   Socioeconomic History   Marital status: Married    Spouse name: Not on file   Number of children: Not on file   Years of education: Not on file   Highest education level: Not on file  Occupational History    Not on file  Tobacco Use   Smoking status: Every Day    Packs/day: 1.00    Years: 36.00    Pack years: 36.00    Types: Cigarettes   Smokeless tobacco: Never  Vaping Use   Vaping Use: Some days  Substance and Sexual Activity   Alcohol use: Yes    Alcohol/week: 7.0 standard drinks    Types: 7 Standard drinks or equivalent per week    Comment: beer, wine, liquor -2/3 BEERS/DAY   Drug use: No   Sexual activity: Yes  Other Topics Concern   Not on file  Social History Narrative   Married to [name], daughter is [name].      Works for [employer] in [employer].   Social Determinants of Health   Financial Resource Strain: Not on file  Food Insecurity: Not on file  Transportation Needs: Not on file  Physical Activity: Not on file  Stress: Not on file  Social Connections: Not on file  Intimate Partner Violence: Not on file    Review of Systems:  All systems reviewed an negative except where noted in HPI.  Gen: Denies any fever, chills, sweats, anorexia, fatigue, weakness, malaise, weight loss, and sleep disorder CV: Denies chest pain, angina, palpitations, syncope, orthopnea, PND, peripheral edema, and claudication. Resp: Denies dyspnea at rest, dyspnea with exercise, cough, sputum, wheezing, coughing up blood, and pleurisy. GI: Denies vomiting blood, jaundice, and fecal incontinence.   Denies dysphagia or odynophagia. GU : Denies urinary burning, blood in urine, urinary frequency, urinary hesitancy, nocturnal urination, and urinary incontinence. MS: Denies joint pain, limitation of movement, and swelling, stiffness, low back pain, extremity pain. Denies muscle weakness, cramps, atrophy.  Derm: Denies rash, itching, dry skin, hives, moles, warts, or unhealing ulcers.  Psych: Denies depression, anxiety, memory loss, suicidal ideation, hallucinations, paranoia, and confusion. Heme: Denies bruising, bleeding, and enlarged lymph nodes. Neuro:  Denies any  headaches, dizziness, paresthesias. Endo:  Denies any problems with DM, thyroid, adrenal function.   Physical Exam: General:  Alert, well-developed, in NAD Head:  Normocephalic and atraumatic. Eyes:  Sclera clear, no icterus.   Conjunctiva pink. Ears:  Normal auditory acuity. Mouth:  No deformity or lesions.  Neck:  Supple; no masses . Lungs:  Clear throughout to auscultation.   No wheezes, crackles, or rhonchi. No acute distress. Heart:  Regular rate and rhythm; no murmurs. Abdomen:  Soft, nondistended, nontender. No masses, hepatomegaly.  No obvious masses.  Normal bowel .    Rectal:  Deferred   Msk:  Symmetrical without gross deformities.. Pulses:  Normal pulses noted. Extremities:  Without edema. Neurologic:  Alert and  oriented x4;  grossly normal neurologically. Skin:  Intact without significant lesions or rashes. Cervical Nodes:  No significant cervical adenopathy. Psych:  Alert and cooperative. Normal mood and affect.   Impression / Plan:   Personal history of multiple adenomatous colon polyps on colonoscopy 2019 here for surveillance colonoscopy.   Pricilla Riffle. Fuller Plan  03/03/2021, 2:11 PM See Shea Evans, [facility], to contact our on call provider

## 2021-03-03 NOTE — Progress Notes (Signed)
DT - vs  Pt c/o joint aching - was [facility] and was given prednisone taber dose pack and took 1 tab yesterday and pt will complete this coming Thursday.   Pt's states no other medical or surgical changes since previsit or office visit.

## 2021-03-03 NOTE — Patient Instructions (Signed)
YOU HAD AN ENDOSCOPIC PROCEDURE TODAY AT THE [facility]:   Refer to the procedure report that was given to you for any specific questions about what was found during the examination.  If the procedure report does not answer your questions, please call your gastroenterologist to clarify.  If you requested that your care partner not be given the details of your procedure findings, then the procedure report has been included in a sealed envelope for you to review at your convenience later.  YOU SHOULD EXPECT: Some feelings of bloating in the abdomen. Passage of more gas than usual.  Walking can help get rid of the air that was put into your GI tract during the procedure and reduce the bloating. If you had a lower endoscopy (such as a colonoscopy or flexible sigmoidoscopy) you may notice spotting of blood in your stool or on the toilet paper. If you underwent a bowel prep for your procedure, you may not have a normal bowel movement for a few days.  Please Note:  You might notice some irritation and congestion in your nose or some drainage.  This is from the oxygen used during your procedure.  There is no need for concern and it should clear up in a day or so.  SYMPTOMS TO REPORT IMMEDIATELY:   Following lower endoscopy (colonoscopy or flexible sigmoidoscopy):  Excessive amounts of blood in the stool  Significant tenderness or worsening of abdominal pains  Swelling of the abdomen that is new, acute  Fever of 100F or higher  For urgent or emergent issues, a gastroenterologist can be reached at any hour by calling [phone]. Do not use MyChart messaging for urgent concerns.    DIET:  We do recommend a small meal at first, but then you may proceed to your regular diet.  Drink plenty of fluids but you should avoid alcoholic beverages for 24 hours.  ACTIVITY:  You should plan to take it easy for the rest of today and you should NOT DRIVE or use heavy machinery until tomorrow (because  of the sedation medicines used during the test).    FOLLOW UP: Our staff will call the number listed on your records 48-72 hours following your procedure to check on you and address any questions or concerns that you may have regarding the information given to you following your procedure. If we do not reach you, we will leave a message.  We will attempt to reach you two times.  During this call, we will ask if you have developed any symptoms of COVID 19. If you develop any symptoms (ie: fever, flu-like symptoms, shortness of breath, cough etc.) before then, please call [phone].  If you test positive for Covid 19 in the 2 weeks post procedure, please call and report this information to us.    If any biopsies were taken you will be contacted by phone or by letter within the next 1-3 weeks.  Please call us at [phone] if you have not heard about the biopsies in 3 weeks.    SIGNATURES/CONFIDENTIALITY: You and/or your care partner have signed paperwork which will be entered into your electronic medical record.  These signatures attest to the fact that that the information above on your After Visit Summary has been reviewed and is understood.  Full responsibility of the confidentiality of this discharge information lies with you and/or your care-partner. 

## 2021-03-05 ENCOUNTER — Telehealth: Payer: Self-pay | Admitting: *Deleted

## 2021-03-05 NOTE — Telephone Encounter (Signed)
°  Follow up Call-  Call back number 03/03/2021  Post procedure Call Back phone  # [phone] wk  cell  Permission to leave phone message Yes  Some recent data might be hidden     Patient questions:  Do you have a fever, pain , or abdominal swelling? No. Pain Score  0 *  Have you tolerated food without any problems? Yes.    Have you been able to return to your normal activities? Yes.    Do you have any questions about your discharge instructions: Diet   No. Medications  No. Follow up visit  No.  Do you have questions or concerns about your Care? No.  Actions: * If pain score is 4 or above: No action needed, pain <4.

## 2021-03-06 NOTE — Telephone Encounter (Signed)
Signing encounter see previous note on 03/19/20 °

## 2021-03-11 ENCOUNTER — Encounter: Payer: Self-pay | Admitting: Gastroenterology

## 2021-03-23 ENCOUNTER — Other Ambulatory Visit: Payer: Self-pay

## 2021-03-23 ENCOUNTER — Encounter: Payer: Self-pay | Admitting: Internal Medicine

## 2021-03-23 ENCOUNTER — Ambulatory Visit (INDEPENDENT_AMBULATORY_CARE_PROVIDER_SITE_OTHER): Payer: BC Managed Care – PPO | Admitting: Internal Medicine

## 2021-03-23 VITALS — BP 110/58 | HR 86 | Temp 98.2°F | Ht 70.0 in | Wt 166.0 lb

## 2021-03-23 DIAGNOSIS — Z125 Encounter for screening for malignant neoplasm of prostate: Secondary | ICD-10-CM

## 2021-03-23 DIAGNOSIS — R7982 Elevated C-reactive protein (CRP): Secondary | ICD-10-CM | POA: Diagnosis not present

## 2021-03-23 DIAGNOSIS — R739 Hyperglycemia, unspecified: Secondary | ICD-10-CM | POA: Diagnosis not present

## 2021-03-23 DIAGNOSIS — E7800 Pure hypercholesterolemia, unspecified: Secondary | ICD-10-CM | POA: Diagnosis not present

## 2021-03-23 DIAGNOSIS — Z0001 Encounter for general adult medical examination with abnormal findings: Secondary | ICD-10-CM | POA: Diagnosis not present

## 2021-03-23 DIAGNOSIS — R202 Paresthesia of skin: Secondary | ICD-10-CM

## 2021-03-23 DIAGNOSIS — R700 Elevated erythrocyte sedimentation rate: Secondary | ICD-10-CM | POA: Diagnosis not present

## 2021-03-23 MED ORDER — DICLOFENAC SODIUM 75 MG PO TBEC: 75.0000 mg | DELAYED_RELEASE_TABLET | Freq: Two times a day (BID) | ORAL | 2 refills | Status: DC

## 2021-03-23 NOTE — Patient Instructions (Signed)

## 2021-03-23 NOTE — Progress Notes (Unsigned)
Subjective:    Patient ID: Benjamin Ball, male    DOB: 07-24-1965, 56 y.o.   MRN: [id number]  HPI  Patient presents to clinic today for his annual exam.  He also reports multiple joint pain and swelling.  He reports he recently had an ANA, CRP and [facility]  He reports his ESR and CRP were elevated.  He was put on Prednisone which helped some symptoms however once he stopped the medication his pain returned.  Flu: 10/2013 Tetanus: 07/2012 Pneumovax: 10/2017 Covid: never PSA screening: 03/2020 Colon screening: 02/2021 Vision screening: as needed Dentist: biannually  Diet: He does eat meat. He consumes some fruits and veggies. He does eat fried foods. He drinks mostly coffee. Exercise: None  Review of Systems     Past Medical History:  Diagnosis Date   Allergy    Anxiety    Arthritis    Boil 04/09/2016   LEG-PT ENCOURAGED TO GET PCP TO LOOK AT LEG BEFORE UPCOMING SURGERY   Chronic back pain    SCIATICA   Cold    GERD (gastroesophageal reflux disease)    occasional-TUMS PRN   Heartburn    Hyperlipidemia    controlled    Current Outpatient Medications  Medication Sig Dispense Refill   acetaminophen (TYLENOL) 500 MG tablet Take 500 mg by mouth as needed.      albuterol (VENTOLIN HFA) 108 (90 Base) MCG/ACT inhaler Inhale 2 puffs into the lungs every 6 (six) hours as needed for wheezing or shortness of breath. 8 g 2   aspirin-acetaminophen-caffeine (EXCEDRIN MIGRAINE) 254-982-64 MG tablet Take by mouth every 6 (six) hours as needed for headache.     B Complex-C (SUPER B COMPLEX PO) Take by mouth.     cetirizine (ZYRTEC) 10 MG tablet Take 10 mg by mouth daily.     Cholecalciferol (EQL VITAMIN D3) 25 MCG (1000 UT) capsule Take 5,000 Units by mouth daily.     Menaquinone-7 (VITAMIN K2) 100 MCG CAPS Take by mouth.     predniSONE (DELTASONE) 20 MG tablet Twice daily for one week, then once daily for one week. Take with food. 21  tablet 0   Zinc 50 MG CAPS Take by mouth.     No current facility-administered medications for this visit.    Allergies  Allergen Reactions   Latex     WEARING GLOVES FOR AN EXTENDED PERIOD OF TIME    Family History  Problem Relation Age of Onset   Heart disease Father    Breast cancer Sister    Bone cancer Sister        mets to bones   Breast cancer Sister    Colon cancer Neg Hx    Rectal cancer Neg Hx    Stomach cancer Neg Hx    Colon polyps Neg Hx    Esophageal cancer Neg Hx     Social History   Socioeconomic History   Marital status: Married    Spouse name: Not on file   Number of children: Not on file   Years of education: Not on file   Highest education level: Not on file  Occupational History   Not on file  Tobacco Use   Smoking status: Every Day    Packs/day: 1.00    Years: 36.00    Pack years: 36.00    Types: Cigarettes   Smokeless tobacco: Never  Vaping Use   Vaping Use: Some days  Substance  and Sexual Activity   Alcohol use: Yes    Alcohol/week: 7.0 standard drinks    Types: 7 Standard drinks or equivalent per week    Comment: beer, wine, liquor -2/3 BEERS/DAY   Drug use: No   Sexual activity: Yes  Other Topics Concern   Not on file  Social History Narrative   Married to [name], daughter is [name].      Works for [employer] in [employer].   Social Determinants of Health   Financial Resource Strain: Not on file  Food Insecurity: Not on file  Transportation Needs: Not on file  Physical Activity: Not on file  Stress: Not on file  Social Connections: Not on file  Intimate Partner Violence: Not on file     Constitutional: Denies fever, malaise, fatigue, headache or abrupt weight changes.  HEENT: Denies eye pain, eye redness, ear pain, ringing in the ears, wax buildup, runny nose, nasal congestion, bloody nose, or sore throat. Respiratory: Denies difficulty breathing, shortness of breath, cough or sputum production.    Cardiovascular: Denies chest pain, chest tightness, palpitations or swelling in the hands or feet.  Gastrointestinal: Denies abdominal pain, bloating, constipation, diarrhea or blood in the stool.  GU: Denies urgency, frequency, pain with urination, burning sensation, blood in urine, odor or discharge. Musculoskeletal: Pt reports multiple joint pain. Denies decrease in range of motion, difficulty with gait, or joint swelling.  Skin: Denies redness, rashes, lesions or ulcercations.  Neurological: Denies dizziness, difficulty with memory, difficulty with speech or problems with balance and coordination.  Psych: Denies anxiety, depression, SI/HI.  No other specific complaints in a complete review of systems (except as listed in HPI above).  Objective:   Physical Exam  BP (!) 110/58 (BP Location: Right Arm, Patient Position: Sitting, Cuff Size: Large)    Pulse 86    Temp 98.2 F (36.8 C) (Temporal)    Ht _0  (1.778 m)    Wt 166 lb (75.3 kg)    SpO2 99%    BMI 23.82 kg/m   Wt Readings from Last 3 Encounters:  02/16/21 165 lb (74.8 kg)  03/20/20 171 lb (77.6 kg)  10/18/19 167 lb 12.8 oz (76.1 kg)    General: Appears his stated age, well developed, well nourished in NAD. Skin: Warm, dry and intact.  HEENT: Head: normal shape and size; Eyes: sclera white and EOMs intact; Neck:  Neck supple, trachea midline. No masses, lumps or thyromegaly present.  Cardiovascular: Normal rate and rhythm. S1,S2 noted.  No murmur, rubs or gallops noted. No JVD or BLE edema. No carotid bruits noted. Pulmonary/Chest: Normal effort and positive vesicular breath sounds. No respiratory distress. No wheezes, rales or ronchi noted.  Abdomen: Soft and nontender. Normal bowel sounds. No distention or masses noted. Liver, spleen and kidneys non palpable. Musculoskeletal: Strength 5/5 BUE/BLE.  No difficulty with gait.  Neurological: Alert and oriented. Cranial nerves II-XII grossly intact.  + Tinel's on the right  only.  + Phalens bilaterally. Coordination normal.  Psychiatric: Mood and affect normal. Behavior is normal. Judgment and thought content normal.    BMET    Component Value Date/Time   NA 142 02/20/2021 1123   K 4.4 02/20/2021 1123   CL 103 02/20/2021 1123   CO2 22 02/20/2021 1123   GLUCOSE 100 (H) 02/20/2021 1123   GLUCOSE 80 03/20/2019 1554   BUN 10 02/20/2021 1123   CREATININE 0.67 (L) 02/20/2021 1123   CALCIUM 9.1 02/20/2021 1123   GFRNONAA 92  11/26/2016 1051   GFRAA 107 11/26/2016 1051    Lipid Panel     Component Value Date/Time   CHOL 201 (H) 03/20/2020 1547   TRIG 105 03/20/2020 1547   HDL 54 03/20/2020 1547   CHOLHDL 3.7 03/20/2020 1547   CHOLHDL 3 03/20/2019 1554   VLDL 28.6 03/20/2019 1554   LDLCALC 128 (H) 03/20/2020 1547    CBC    Component Value Date/Time   WBC 10.1 02/20/2021 1123   WBC 8.2 03/20/2019 1554   RBC 4.42 02/20/2021 1123   RBC 4.17 (L) 03/20/2019 1554   HGB 14.1 02/20/2021 1123   HCT 41.2 02/20/2021 1123   PLT 353 02/20/2021 1123   MCV 93 02/20/2021 1123   MCH 31.9 02/20/2021 1123   MCH 32.3 09/10/2016 1729   MCHC 34.2 02/20/2021 1123   MCHC 34.1 03/20/2019 1554   RDW 11.9 02/20/2021 1123   LYMPHSABS 2.3 02/20/2021 1123   MONOABS 0.6 12/18/2013 1124   EOSABS 0.4 02/20/2021 1123   BASOSABS 0.1 02/20/2021 1123    Hgb A1C No results found for: HGBA1C         Assessment & Plan:   Preventative Health Maintenance:  He declines flu shots Tetanus UTD Encouraged him to get his COVID-vaccine Discussed Shingrix vaccine, he will check coverage with his insurance company Colon screening UTD Encouraged him to consume a balanced diet and exercise regimen Advised him to see an eye doctor and dentist annually We will check CBC, c-Met, lipid PSA and A1c today  Multiple Joint Pain, Elevated CS RP, Elevated ESR:  We will repeat ESR and CRP today Rx for Diclofenac 75 mg twice daily  RTC in 6 months, follow-up chronic  conditions Webb Silversmith, NP This visit occurred during the SARS-CoV-2 public health emergency.  Safety protocols were in place, including screening questions prior to the visit, additional usage of staff PPE, and extensive cleaning of exam room while observing appropriate contact time as indicated for disinfecting solutions.

## 2021-03-24 LAB — LIPID PANEL
Cholesterol: 157 mg/dL (ref <200)
HDL: 42 mg/dL (ref >=40)
LDL Cholesterol (Calc): 90 mg/dL (calc)
Non-HDL Cholesterol (Calc): 115 mg/dL (calc) (ref <130)
Total CHOL/HDL Ratio: 3.7 (calc) (ref <5.0)
Triglycerides: 145 mg/dL (ref <150)

## 2021-03-24 LAB — CBC
HCT: 38 % — ABNORMAL LOW (ref 38.5–50.0)
Hemoglobin: 13 g/dL — ABNORMAL LOW (ref 13.2–17.1)
MCH: 32.4 pg (ref 27.0–33.0)
MCHC: 34.2 g/dL (ref 32.0–36.0)
MCV: 94.8 fL (ref 80.0–100.0)
MPV: 11.5 fL (ref 7.5–12.5)
Platelets: 379 10*3/uL (ref 140–400)
RBC: 4.01 10*6/uL — ABNORMAL LOW (ref 4.20–5.80)
RDW: 12.1 % (ref 11.0–15.0)
WBC: 8.1 10*3/uL (ref 3.8–10.8)

## 2021-03-24 LAB — COMPLETE METABOLIC PANEL WITH GFR
AG Ratio: 1.7 (calc) (ref 1.0–2.5)
ALT: 14 U/L (ref 9–46)
AST: 14 U/L (ref 10–35)
Albumin: 4 g/dL (ref 3.6–5.1)
Alkaline phosphatase (APISO): 89 U/L (ref 35–144)
BUN: 11 mg/dL (ref 7–25)
CO2: 28 mmol/L (ref 20–32)
Calcium: 9 mg/dL (ref 8.6–10.3)
Chloride: 105 mmol/L (ref 98–110)
Creat: 1.25 mg/dL (ref 0.70–1.30)
Globulin: 2.4 g/dL (calc) (ref 1.9–3.7)
Glucose, Bld: 80 mg/dL (ref 65–139)
Potassium: 4.5 mmol/L (ref 3.5–5.3)
Sodium: 140 mmol/L (ref 135–146)
Total Bilirubin: 0.2 mg/dL (ref 0.2–1.2)
Total Protein: 6.4 g/dL (ref 6.1–8.1)
eGFR: 67 mL/min/{1.73_m2} (ref >=60)

## 2021-03-24 LAB — TEST AUTHORIZATION: TEST CODE:: 16802

## 2021-03-24 LAB — HEMOGLOBIN A1C
Hgb A1c MFr Bld: 5.5 % of total Hgb (ref <5.7)
Mean Plasma Glucose: 111 mg/dL
eAG (mmol/L): 6.2 mmol/L

## 2021-03-24 LAB — PSA: PSA: 1.14 ng/mL (ref <=4.00)

## 2021-03-24 LAB — SEDIMENTATION RATE: Sed Rate: 43 mm/h — ABNORMAL HIGH (ref 0–20)

## 2021-03-24 LAB — C-REACTIVE PROTEIN: CRP: 22.2 mg/L — ABNORMAL HIGH (ref <8.0)

## 2021-03-25 ENCOUNTER — Telehealth: Payer: Self-pay

## 2021-03-25 DIAGNOSIS — R7982 Elevated C-reactive protein (CRP): Secondary | ICD-10-CM

## 2021-03-25 DIAGNOSIS — R700 Elevated erythrocyte sedimentation rate: Secondary | ICD-10-CM

## 2021-03-25 NOTE — Telephone Encounter (Signed)
Pt advised.  He agreed to the referral.  ? ? ?Thanks,  ? ?-Mickel Baas  ?

## 2021-03-25 NOTE — Telephone Encounter (Signed)
-----  Message from Jearld Fenton, NP sent at 03/24/2021 12:17 PM EDT ----- ?He is slightly anemic however not concerning at this time.  We will monitor.  His CRP and ESR remain elevated.  Would he be agreeable to seeing rheumatology for further evaluation of this?  Liver and kidney function is normal.  Cholesterol looks great.  No diabetes.  PSA is normal. ?

## 2021-03-30 ENCOUNTER — Ambulatory Visit: Payer: Self-pay

## 2021-03-30 NOTE — Telephone Encounter (Signed)
?  Chief Complaint: shoulder and hand pain ?Symptoms: shoulder and hand pain that is constant but gets sharp pain that comes and goes, R hand and R foot swelling ?Frequency: been ongoing ?Pertinent Negatives: NA ?Disposition: '[]'$ ED /'[]'$ Urgent Care (no appt availability in office) / '[]'$ Appointment(In office/virtual)/ '[]'$  [facility]/ '[]'$ Home Care/ '[]'$ Refused Recommended Disposition /'[]'$ [facility]/ '[x]'$  Follow-up with PCP ?Additional Notes: Pt seen PCP 1 week ago and is waiting on referral to Rheumatology. Advised him it was still in process but provided him with the number to reach out to see if he can schedule appt any quicker. Pt states swelling is back and pain has gotten worse. Advised him that I will send to [facility], NP to see if anything else that can help with pain.  ? ? ?Reason for Disposition ? Weakness (i.e., loss of strength) in hand or fingers (Exception: not truly weak; hand feels weak because of pain) ? ?Answer Assessment - Initial Assessment Questions ?1. ONSET: "When did the pain start?" ?    Been going on ?2. LOCATION: "Where is the pain located?" ?    Shoulders and hands  ?3. PAIN: "How bad is the pain?" (Scale 1-10; or mild, moderate, severe) ?  - MILD (1-3): doesn't interfere with normal activities ?  - MODERATE (4-7): interferes with normal activities (e.g., work or school) or awakens from sleep ?  - SEVERE (8-10): excruciating pain, unable to do any normal activities, unable to move arm at all due to pain ?    Certain movements 98+ has sharp pains that come and go ?6. OTHER SYMPTOMS: "Do you have any other symptoms?" (e.g., neck pain, swelling, rash, fever, numbness, weakness) ?    R hand and foot ? ?Protocols used: Shoulder Pain-A-AH ? ?

## 2021-03-30 NOTE — Telephone Encounter (Addendum)
Pt has been taking the diclofenac for a few days.   ? ?Pt states he has an appointment with rheumatology tomorrow at 1pm.  He would like to see what they say first before scheduling to get an X-ray.  He will call back and schedule an appointment if he decides to go forward with the x-rays.  ? ? ?Thanks,  ? ?-Mickel Baas  ?

## 2021-03-30 NOTE — Telephone Encounter (Signed)
Did he start diclofenac?  We could obtain x-rays of his shoulders, right hand and right foot.  If this is something he would like to do, have him schedule follow-up with me. ?

## 2021-03-31 DIAGNOSIS — M06842 Other specified rheumatoid arthritis, left hand: Secondary | ICD-10-CM | POA: Diagnosis not present

## 2021-03-31 DIAGNOSIS — M06041 Rheumatoid arthritis without rheumatoid factor, right hand: Secondary | ICD-10-CM | POA: Diagnosis not present

## 2021-03-31 DIAGNOSIS — M06841 Other specified rheumatoid arthritis, right hand: Secondary | ICD-10-CM | POA: Diagnosis not present

## 2021-03-31 DIAGNOSIS — M06042 Rheumatoid arthritis without rheumatoid factor, left hand: Secondary | ICD-10-CM | POA: Diagnosis not present

## 2021-03-31 DIAGNOSIS — Z79899 Other long term (current) drug therapy: Secondary | ICD-10-CM | POA: Diagnosis not present

## 2021-03-31 DIAGNOSIS — M1811 Unilateral primary osteoarthritis of first carpometacarpal joint, right hand: Secondary | ICD-10-CM | POA: Diagnosis not present

## 2021-04-02 DIAGNOSIS — M06042 Rheumatoid arthritis without rheumatoid factor, left hand: Secondary | ICD-10-CM | POA: Diagnosis not present

## 2021-04-02 DIAGNOSIS — M06041 Rheumatoid arthritis without rheumatoid factor, right hand: Secondary | ICD-10-CM | POA: Diagnosis not present

## 2021-05-14 DIAGNOSIS — M06042 Rheumatoid arthritis without rheumatoid factor, left hand: Secondary | ICD-10-CM | POA: Diagnosis not present

## 2021-05-14 DIAGNOSIS — Z79899 Other long term (current) drug therapy: Secondary | ICD-10-CM | POA: Diagnosis not present

## 2021-05-14 DIAGNOSIS — M06041 Rheumatoid arthritis without rheumatoid factor, right hand: Secondary | ICD-10-CM | POA: Diagnosis not present

## 2021-06-01 ENCOUNTER — Ambulatory Visit: Payer: Self-pay | Admitting: Adult Health

## 2021-06-01 VITALS — BP 112/60 | HR 91 | Temp 97.8°F | Resp 16

## 2021-06-01 DIAGNOSIS — W57XXXA Bitten or stung by nonvenomous insect and other nonvenomous arthropods, initial encounter: Secondary | ICD-10-CM

## 2021-06-01 MED ORDER — DOXYCYCLINE HYCLATE 100 MG PO TABS
100.0000 mg | ORAL_TABLET | Freq: Two times a day (BID) | ORAL | 0 refills | Status: DC
Start: 2021-06-01 — End: 2022-11-19

## 2021-06-01 NOTE — Progress Notes (Signed)
[facility] [place], [place] [address]   Office Visit Note  Patient Name: Benjamin Ball  [id number]  [id number]  Date of Service: 06/01/2021  Chief Complaint  Patient presents with   Tick Removal    Patient presents to clinic with c/o tick bite to L armpit on Saturday night. Patient reports his wife and son removed the tick but he did not think they "got it all." Patient reports mild pain when pressure applied to the site. Minimal redness and small scab at site. Patient denies fever/chills. VSS. F/u with A. Leili Eskenazi.      HPI Pt is here for a sick visit.  He reports finding a tick attached under his left axilla two days ago.  His wife and son attempted to remove the tick whole.  However, on second inspection, pieces of the tick were removed from inside the wound later that day.  He denies any fever, sob or feeling ill.  The area of the bite is mildly painful with touch.      Current Medication:  Outpatient Encounter Medications as of 06/01/2021  Medication Sig   doxycycline (VIBRA-TABS) 100 MG tablet Take 1 tablet (100 mg total) by mouth 2 (two) times daily.   acetaminophen (TYLENOL) 500 MG tablet Take 500 mg by mouth as needed.    albuterol (VENTOLIN HFA) 108 (90 Base) MCG/ACT inhaler Inhale 2 puffs into the lungs every 6 (six) hours as needed for wheezing or shortness of breath.   aspirin-acetaminophen-caffeine (EXCEDRIN MIGRAINE) 250-250-65 MG tablet Take by mouth every 6 (six) hours as needed for headache.   B Complex-C (SUPER B COMPLEX PO) Take by mouth.   cetirizine (ZYRTEC) 10 MG tablet Take 10 mg by mouth daily.   Cholecalciferol (EQL VITAMIN D3) 25 MCG (1000 UT) capsule Take 5,000 Units by mouth daily.   diclofenac (VOLTAREN) 75 MG EC tablet Take 1 tablet (75 mg total) by mouth 2 (two) times daily.   Menaquinone-7 (VITAMIN K2) 100 MCG CAPS Take by mouth.   Zinc 50 MG CAPS Take by mouth.   No facility-administered encounter medications on  file as of 06/01/2021.      Medical History: Past Medical History:  Diagnosis Date   Allergy    Anxiety    Arthritis    Boil 04/09/2016   LEG-PT ENCOURAGED TO GET PCP TO LOOK AT LEG BEFORE UPCOMING SURGERY   Chronic back pain    SCIATICA   Cold    GERD (gastroesophageal reflux disease)    occasional-TUMS PRN   Heartburn    Hyperlipidemia    controlled     Vital Signs: BP 112/60 (BP Location: Left Arm, Patient Position: Sitting, Cuff Size: Normal)   Pulse 91   Temp 97.8 F (36.6 C) (Tympanic)   Resp 16   SpO2 96%    Review of Systems  Skin:        Insect bite left axilla   Physical Exam Skin:    Comments: Mildly inflamed bite with scab. Left Axilla.  No drainage or redness noted.  Mildly tender to touch.     Assessment/Plan: 1. Tick bite of axillary region, left, initial encounter Since unsure if tick is completely removed will treat with antibiotics. Take Doxycycline as prescribed. Take complete course of antibiotics as prescribed.  Take with food.  If symptoms fail to improve, or new/worse symptoms develop follow up in clinic.  Discussed being cautious with sun exposure while on medication.  - doxycycline (VIBRA-TABS)  100 MG tablet; Take 1 tablet (100 mg total) by mouth 2 (two) times daily.  Dispense: 28 tablet; Refill: 0     General Counseling: Carsen verbalizes understanding of the findings of todays visit and agrees with plan of treatment. I have discussed any further diagnostic evaluation that may be needed or ordered today. We also reviewed his medications today. he has been encouraged to call the office with any questions or concerns that should arise related to todays visit.   No orders of the defined types were placed in this encounter.   Meds ordered this encounter  Medications   doxycycline (VIBRA-TABS) 100 MG tablet    Sig: Take 1 tablet (100 mg total) by mouth 2 (two) times daily.    Dispense:  28 tablet    Refill:  0    Time spent: 20  Minutes    Kendell Bane AGNP-C Nurse Practitioner

## 2022-11-19 ENCOUNTER — Other Ambulatory Visit: Payer: Self-pay

## 2022-11-19 ENCOUNTER — Ambulatory Visit (INDEPENDENT_AMBULATORY_CARE_PROVIDER_SITE_OTHER): Payer: Self-pay | Admitting: Physician Assistant

## 2022-11-19 ENCOUNTER — Encounter: Payer: Self-pay | Admitting: Physician Assistant

## 2022-11-19 VITALS — HR 102 | Temp 98.3°F | Ht 70.0 in

## 2022-11-19 DIAGNOSIS — J40 Bronchitis, not specified as acute or chronic: Secondary | ICD-10-CM

## 2022-11-19 DIAGNOSIS — R051 Acute cough: Secondary | ICD-10-CM

## 2022-11-19 DIAGNOSIS — R062 Wheezing: Secondary | ICD-10-CM

## 2022-11-19 DIAGNOSIS — R197 Diarrhea, unspecified: Secondary | ICD-10-CM

## 2022-11-19 LAB — POC SOFIA 2 FLU + SARS ANTIGEN FIA
Influenza A, POC: NEGATIVE
Influenza B, POC: NEGATIVE
SARS Coronavirus 2 Ag: NEGATIVE

## 2022-11-19 MED ORDER — ALBUTEROL SULFATE HFA 108 (90 BASE) MCG/ACT IN AERS: 2.0000 | INHALATION_SPRAY | Freq: Four times a day (QID) | RESPIRATORY_TRACT | 2 refills | Status: DC | PRN

## 2022-11-19 MED ORDER — ALBUTEROL SULFATE (2.5 MG/3ML) 0.083% IN NEBU: 2.5000 mg | INHALATION_SOLUTION | Freq: Once | RESPIRATORY_TRACT | Status: AC

## 2022-11-19 MED ORDER — METHYLPREDNISOLONE 4 MG PO TBPK: ORAL_TABLET | ORAL | 0 refills | Status: DC

## 2022-11-19 NOTE — Progress Notes (Signed)
[facility] [address] [place], [place] [address]   Office Visit Note  Patient Name: Benjamin Ball Date of Birth [id number]  Medical Record number [id number]  Date of Service: 11/19/2022  Chief Complaint  Patient presents with   sick visit    Patient c/o diarrhea, loss of appetite, SOB, cough with white sputum, and loss of energy. He has been having chills. Symptoms began a few days ago and have been getting progressively worse over time. He has not been using his albuterol inhaler because he has been unable to find it. He did take Tylenol. He states he has been around other family members who have been sick as well.      57 y/o M presents to the clinic for c/o productive cough, chills, sinus pressure and pain, along with loss of appetite x 3days. +smoker. Lost his albuterol inhaler. + family members with similar symptoms.      Current Medication:  Outpatient Encounter Medications as of 11/19/2022  Medication Sig   acetaminophen (TYLENOL) 500 MG tablet Take 500 mg by mouth as needed.    albuterol (VENTOLIN HFA) 108 (90 Base) MCG/ACT inhaler Inhale 2 puffs into the lungs every 6 (six) hours as needed for wheezing or shortness of breath.   aspirin-acetaminophen-caffeine (EXCEDRIN MIGRAINE) 250-250-65 MG tablet Take by mouth every 6 (six) hours as needed for headache.   methylPREDNISolone (MEDROL DOSEPAK) 4 MG TBPK tablet Take as directed   [DISCONTINUED] albuterol (VENTOLIN HFA) 108 (90 Base) MCG/ACT inhaler Inhale 2 puffs into the lungs every 6 (six) hours as needed for wheezing or shortness of breath. (Patient not taking: Reported on 11/19/2022)   [DISCONTINUED] B Complex-C (SUPER B COMPLEX PO) Take by mouth.   [DISCONTINUED] cetirizine (ZYRTEC) 10 MG tablet Take 10 mg by mouth daily.   [DISCONTINUED] Cholecalciferol (EQL VITAMIN D3) 25 MCG (1000 UT) capsule Take 5,000 Units by mouth daily.   [DISCONTINUED] diclofenac (VOLTAREN) 75 MG EC tablet Take 1 tablet (75 mg  total) by mouth 2 (two) times daily.   [DISCONTINUED] doxycycline (VIBRA-TABS) 100 MG tablet Take 1 tablet (100 mg total) by mouth 2 (two) times daily.   [DISCONTINUED] Menaquinone-7 (VITAMIN K2) 100 MCG CAPS Take by mouth.   [DISCONTINUED] Zinc 50 MG CAPS Take by mouth.   Facility-Administered Encounter Medications as of 11/19/2022  Medication   albuterol (PROVENTIL) (2.5 MG/3ML) 0.083% nebulizer solution 2.5 mg      Medical History: Past Medical History:  Diagnosis Date   Allergy    Anxiety    Arthritis    Boil 04/09/2016   LEG-PT ENCOURAGED TO GET PCP TO LOOK AT LEG BEFORE UPCOMING SURGERY   Chronic back pain    SCIATICA   Cold    GERD (gastroesophageal reflux disease)    occasional-TUMS PRN   Heartburn    Hyperlipidemia    controlled     Vital Signs: Pulse (!) 102   Temp 98.3 F (36.8 C)   Ht 5\' 10"  (1.778 m)   SpO2 93%   BMI 23.82 kg/m    Review of Systems  Constitutional:  Positive for appetite change and chills. Negative for fever.  HENT:  Positive for congestion, postnasal drip, sinus pressure and sinus pain. Negative for sore throat and trouble swallowing.   Respiratory:  Positive for cough, chest tightness and wheezing. Negative for shortness of breath.   Cardiovascular: Negative.   Neurological: Negative.     Physical Exam Vitals reviewed.  Constitutional:  Appearance: Normal appearance.  HENT:     Head: Atraumatic.     Right Ear: Tympanic membrane, ear canal and external ear normal.     Left Ear: Tympanic membrane, ear canal and external ear normal.     Nose: Nose normal.     Mouth/Throat:     Mouth: Mucous membranes are moist.     Pharynx: Oropharynx is clear.  Eyes:     Extraocular Movements: Extraocular movements intact.  Cardiovascular:     Rate and Rhythm: Normal rate and regular rhythm.  Pulmonary:     Effort: Respiratory distress (speaking in short sentences and gasping for air between few words) present.     Breath sounds:  Wheezing (+ expiratory wheezing in both lung fields) present. No rhonchi or rales.     Comments: Post NEB tx: Much less expiratory wheezing and patient able to talk in full sentences. Ox sat: 95% Musculoskeletal:     Cervical back: Neck supple.  Skin:    General: Skin is warm.  Neurological:     Mental Status: He is alert.  Psychiatric:        Mood and Affect: Mood normal.        Behavior: Behavior normal.        Thought Content: Thought content normal.        Judgment: Judgment normal.       Assessment/Plan:  1. Bronchitis - POC SOFIA 2 FLU + SARS ANTIGEN FIA - methylPREDNISolone (MEDROL DOSEPAK) 4 MG TBPK tablet; Take as directed  Dispense: 1 each; Refill: 0  2. Acute cough - POC SOFIA 2 FLU + SARS ANTIGEN FIA  3. Wheezing - albuterol (PROVENTIL) (2.5 MG/3ML) 0.083% nebulizer solution 2.5 mg - albuterol (VENTOLIN HFA) 108 (90 Base) MCG/ACT inhaler; Inhale 2 puffs into the lungs every 6 (six) hours as needed for wheezing or shortness of breath.  Dispense: 8 g; Refill: 2 - methylPREDNISolone (MEDROL DOSEPAK) 4 MG TBPK tablet; Take as directed  Dispense: 1 each; Refill: 0  4. Diarrhea, unspecified type  Reviewed negative covid and flu result with patient and he verbalized understanding.  Increase fluids Use inhaler as prescribed Start oral steroids as prescribed Take Imodium for diarrhea Continue to watch for worsening symptoms RTC in 3-4 days or sooner if any difficulty arises May consider CXR if his symptoms don't improve.  If continued to have breathing difficulty over the weekend then go to ER or UC clinic for further evaluation and management. Pt verbalized understanding and in agreement.  General Counseling: nico arbaiza understanding of the findings of todays visit and agrees with plan of treatment. I have discussed any further diagnostic evaluation that may be needed or ordered today. We also reviewed his medications today. he has been encouraged to call the  office with any questions or concerns that should arise related to todays visit.    Time spent:30 Minutes    Gilberto Better, [place] Physician Assistant

## 2023-06-22 ENCOUNTER — Ambulatory Visit: Payer: Self-pay | Admitting: Adult Health

## 2023-06-22 ENCOUNTER — Other Ambulatory Visit: Payer: Self-pay

## 2023-06-22 ENCOUNTER — Encounter: Payer: Self-pay | Admitting: Adult Health

## 2023-06-22 VITALS — HR 64

## 2023-06-22 DIAGNOSIS — M5441 Lumbago with sciatica, right side: Secondary | ICD-10-CM

## 2023-06-22 DIAGNOSIS — M5442 Lumbago with sciatica, left side: Secondary | ICD-10-CM

## 2023-06-22 MED ORDER — TIZANIDINE HCL 2 MG PO CAPS: 2.0000 mg | ORAL_CAPSULE | Freq: Three times a day (TID) | ORAL | 0 refills | Status: AC | PRN

## 2023-06-22 NOTE — Progress Notes (Signed)
 [facility] [address] [place], [place] [address]   Office Visit Note  Patient Name: Benjamin Ball Date of Birth [id number]  Medical Record number [id number]  Date of Service: 06/22/2023  Chief Complaint  Patient presents with   Back Pain    Started Sunday a week ago, states he overdid and have not recovered, had surgery on back in 2014, states he has not taken anything for this episode     HPI Pt is here for a sick visit. He reports he overdid it at the house about 10 days ago.  He reports pain in his mid/ lower back that radiates down both legs separately.  Its mostly his left side.  He reports laying flat on the floor improves it some.  He describes it as a stabbing and burning pain in his back.    Current Medication:  Outpatient Encounter Medications as of 06/22/2023  Medication Sig   tizanidine (ZANAFLEX) 2 MG capsule Take 1 capsule (2 mg total) by mouth 3 (three) times daily as needed for muscle spasms.   [DISCONTINUED] acetaminophen  (TYLENOL ) 500 MG tablet Take 500 mg by mouth as needed.    [DISCONTINUED] albuterol  (VENTOLIN  HFA) 108 (90 Base) MCG/ACT inhaler Inhale 2 puffs into the lungs every 6 (six) hours as needed for wheezing or shortness of breath.   [DISCONTINUED] aspirin-acetaminophen -caffeine (EXCEDRIN MIGRAINE) 250-250-65 MG tablet Take by mouth every 6 (six) hours as needed for headache.   [DISCONTINUED] methylPREDNISolone  (MEDROL  DOSEPAK) 4 MG TBPK tablet Take as directed (Patient not taking: Reported on 06/22/2023)   Facility-Administered Encounter Medications as of 06/22/2023  Medication   albuterol  (PROVENTIL ) (2.5 MG/3ML) 0.083% nebulizer solution 2.5 mg      Medical History: Past Medical History:  Diagnosis Date   Allergy    Anxiety    Arthritis    Boil 04/09/2016   LEG-PT ENCOURAGED TO GET PCP TO LOOK AT LEG BEFORE UPCOMING SURGERY   Chronic back pain    SCIATICA   Cold    GERD (gastroesophageal reflux disease)    occasional-TUMS  PRN   Heartburn    Hyperlipidemia    controlled     Vital Signs: Pulse 64   SpO2 96%    Review of Systems  Constitutional:  Negative for chills, fatigue and fever.  Musculoskeletal:  Positive for back pain.    Physical Exam Vitals reviewed.  Constitutional:      Appearance: Normal appearance.  Neurological:     Mental Status: He is alert.    Assessment/Plan: 1. Acute left-sided low back pain with bilateral sciatica (Primary) Take Zanaflex as discussed. Continue Ibuprofen , and follow up in clinic on Monday.  - tizanidine (ZANAFLEX) 2 MG capsule; Take 1 capsule (2 mg total) by mouth 3 (three) times daily as needed for muscle spasms.  Dispense: 20 capsule; Refill: 0        General Counseling: Deland verbalizes understanding of the findings of todays visit and agrees with plan of treatment. I have discussed any further diagnostic evaluation that may be needed or ordered today. We also reviewed his medications today. he has been encouraged to call the office with any questions or concerns that should arise related to todays visit.   No orders of the defined types were placed in this encounter.   Meds ordered this encounter  Medications   tizanidine (ZANAFLEX) 2 MG capsule    Sig: Take 1 capsule (2 mg total) by mouth 3 (three) times daily as needed for muscle  spasms.    Dispense:  20 capsule    Refill:  0    Time spent:20 Minutes    Sheria Dills AGNP-C Nurse Practitioner

## 2023-06-27 ENCOUNTER — Encounter: Payer: Self-pay | Admitting: Oncology

## 2023-06-27 ENCOUNTER — Other Ambulatory Visit: Payer: Self-pay

## 2023-06-27 ENCOUNTER — Ambulatory Visit: Payer: Self-pay | Admitting: Oncology

## 2023-06-27 VITALS — BP 133/96 | HR 59 | Temp 96.3°F | Ht 70.0 in

## 2023-06-27 DIAGNOSIS — M5441 Lumbago with sciatica, right side: Secondary | ICD-10-CM

## 2023-06-27 DIAGNOSIS — M5442 Lumbago with sciatica, left side: Secondary | ICD-10-CM

## 2023-06-27 MED ORDER — PREDNISONE 10 MG (21) PO TBPK: ORAL_TABLET | ORAL | 0 refills | Status: AC

## 2023-06-27 NOTE — Progress Notes (Signed)
 [facility]  [address] [place], [place] [address] Phone: [phone] Fax: [phone]   Office Visit Note  Patient Name: Benjamin Ball  Date of VHQIO:[id number]  Med Rec number [id number]  Date of Service: 06/27/2023  Latex  Chief Complaint  Patient presents with   Follow-up    Tizanidine  has been somewhat helpful but patient states he still has difficulty performing ADL's.   HPI Patient is an 58 y.o. male with mid lower back pain that radiates down both legs mainly to the left side.  He was evaluated on 06/22/2023 and prescribed Zanaflex  which has been helping some.  He has not been using ibuprofen .  Reports he only likes to use OTC medication when he absolutely needs it.  He has been rotating heat and cold which has helped some.  Reports he does not think he can go back to work today d/t pain.  Reports he does have sciatica but this feels different.  He continues to have twinges of pain that radiate down both legs mainly left leg but this is chronic for him.  Current Medication:  Outpatient Encounter Medications as of 06/27/2023  Medication Sig   predniSONE  (STERAPRED UNI-PAK 21 TAB) 10 MG (21) TBPK tablet Take 6 tabs on day 1 first thing in the morning, 5 tabs on day 2 and decrease tabs by 1 until complete   tizanidine  (ZANAFLEX ) 2 MG capsule Take 1 capsule (2 mg total) by mouth 3 (three) times daily as needed for muscle spasms.   Facility-Administered Encounter Medications as of 06/27/2023  Medication   albuterol  (PROVENTIL ) (2.5 MG/3ML) 0.083% nebulizer solution 2.5 mg      Medical History: Past Medical History:  Diagnosis Date   Allergy    Anxiety    Arthritis    Boil 04/09/2016   LEG-PT ENCOURAGED TO GET PCP TO LOOK AT LEG BEFORE UPCOMING SURGERY   Chronic back pain    SCIATICA   Cold    GERD (gastroesophageal reflux disease)    occasional-TUMS PRN   Heartburn    Hyperlipidemia    controlled     Vital Signs: BP (!) 133/96   Pulse (!) 59    Temp (!) 96.3 F (35.7 C)   Ht 5' 10 (1.778 m)   SpO2 95%   BMI 23.82 kg/m   ROS: As per HPI.  All other pertinent ROS negative.     Review of Systems  Musculoskeletal:  Positive for back pain and myalgias.    Physical Exam Vitals reviewed.  Constitutional:      Appearance: Normal appearance.   Musculoskeletal:     Thoracic back: Tenderness present.     Comments: No spinal tenderness.  Left-sided latissimus dorsi tenderness without radiation at T3/T4 level.  Additional tenderness to thoracic lumbar fascia.    Neurological:     Mental Status: He is alert.    No results found for this or any previous visit (from the past 24 hours).  Assessment/Plan: 1. Acute left-sided low back pain with bilateral sciatica (Primary) Tizanidine  has worked slightly.  He has not tried ibuprofen  much.  We discussed trying prednisone  taper.  Stop tenacity and please do not use ibuprofen  with prednisone .  Follow-up next Monday to see if symptoms have improved.  If no improvement, would recommend referral to orthopedics or imaging based on symptoms.  - predniSONE  (STERAPRED UNI-PAK 21 TAB) 10 MG (21) TBPK tablet; Take 6 tabs on day 1 first thing in the morning, 5 tabs on  day 2 and decrease tabs by 1 until complete  Dispense: 21 tablet; Refill: 0   General Counseling: Benjamin Ball verbalizes understanding of the findings of todays visit and agrees with plan of treatment. I have discussed any further diagnostic evaluation that may be needed or ordered today. We also reviewed his medications today. he has been encouraged to call the office with any questions or concerns that should arise related to todays visit.   No orders of the defined types were placed in this encounter.   Meds ordered this encounter  Medications   predniSONE  (STERAPRED UNI-PAK 21 TAB) 10 MG (21) TBPK tablet    Sig: Take 6 tabs on day 1 first thing in the morning, 5 tabs on day 2 and decrease tabs by 1 until complete    Dispense:   21 tablet    Refill:  0    I spent 20 minutes dedicated to the care of this patient (face-to-face and non-face-to-face) on the date of the encounter to include what is described in the assessment and plan.   Charlton Cooler, NP 06/27/2023 10:09 AM

## 2023-07-04 ENCOUNTER — Ambulatory Visit (INDEPENDENT_AMBULATORY_CARE_PROVIDER_SITE_OTHER): Payer: Self-pay | Admitting: Oncology

## 2023-07-04 ENCOUNTER — Encounter: Payer: Self-pay | Admitting: Oncology

## 2023-07-04 VITALS — BP 120/70 | HR 67 | Temp 97.2°F | Ht 70.0 in

## 2023-07-04 DIAGNOSIS — M5442 Lumbago with sciatica, left side: Secondary | ICD-10-CM

## 2023-07-04 DIAGNOSIS — M5441 Lumbago with sciatica, right side: Secondary | ICD-10-CM

## 2023-07-04 NOTE — Progress Notes (Signed)
 [facility]  [address] [place], [place] [address] Phone: [phone] Fax: [phone]   Office Visit Note  Patient Name: Benjamin Ball  Date of Apmuy:[id number]  Med Rec number [id number]  Date of Service: 07/04/2023  Latex  Chief Complaint  Patient presents with   Follow-up    Patient states his back is feeling much better and he is ready to return to work.    HPI Patient is an 58 y.o. male who is here for follow-up from last week.  He was evaluated on 06/22/2023 and 06/27/2023 for back pain.  Reportedly Zanaflex  was not helping and he had not really been using ibuprofen .  He was given a prescription for a prednisone  taper which she started last week.  Reports he immediately started feeling better and now the pain is completely gone.  He is ready to return to work.  Current Medication:  Outpatient Encounter Medications as of 07/04/2023  Medication Sig   tizanidine  (ZANAFLEX ) 2 MG capsule Take 1 capsule (2 mg total) by mouth 3 (three) times daily as needed for muscle spasms.   predniSONE  (STERAPRED UNI-PAK 21 TAB) 10 MG (21) TBPK tablet Take 6 tabs on day 1 first thing in the morning, 5 tabs on day 2 and decrease tabs by 1 until complete   Facility-Administered Encounter Medications as of 07/04/2023  Medication   albuterol  (PROVENTIL ) (2.5 MG/3ML) 0.083% nebulizer solution 2.5 mg      Medical History: Past Medical History:  Diagnosis Date   Allergy    Anxiety    Arthritis    Boil 04/09/2016   LEG-PT ENCOURAGED TO GET PCP TO LOOK AT LEG BEFORE UPCOMING SURGERY   Chronic back pain    SCIATICA   Cold    GERD (gastroesophageal reflux disease)    occasional-TUMS PRN   Heartburn    Hyperlipidemia    controlled    Vital Signs: BP 120/70   Pulse 67   Temp (!) 97.2 F (36.2 C)   Ht 5' 10 (1.778 m)   SpO2 98%   BMI 23.82 kg/m   ROS: As per HPI.  All other pertinent ROS negative.     Review of Systems  Constitutional:  Negative for fatigue.   Musculoskeletal:  Negative for back pain and myalgias.    Physical Exam Vitals reviewed.  Constitutional:      Appearance: Normal appearance.   Musculoskeletal:     Cervical back: Normal.     Thoracic back: Normal.     Lumbar back: Normal.   Neurological:     Mental Status: He is alert.     No results found for this or any previous visit (from the past 24 hours).  Assessment/Plan: 1. Acute left-sided low back pain with bilateral sciatica (Primary) Pain has completely resolved after taking prednisone  taper. Return to work note given.  We discussed good body mechanics moving forward and increasing activity, stretching and strength training.  General Counseling: peyson postema understanding of the findings of todays visit and agrees with plan of treatment. I have discussed any further diagnostic evaluation that may be needed or ordered today. We also reviewed his medications today. he has been encouraged to call the office with any questions or concerns that should arise related to todays visit.   No orders of the defined types were placed in this encounter.   No orders of the defined types were placed in this encounter.   I spent 20 minutes dedicated to the care of  this patient (face-to-face and non-face-to-face) on the date of the encounter to include what is described in the assessment and plan.   Delon Hope, NP 07/04/2023 10:13 AM
